# Patient Record
Sex: Male | Born: 1960 | Race: White | Hispanic: No | Marital: Married | Smoking: Never smoker
Health system: Southern US, Community
[De-identification: ages and names within clinical notes are randomized; demographics above are authoritative.]

## PROBLEM LIST (undated history)

## (undated) DIAGNOSIS — M1990 Unspecified osteoarthritis, unspecified site: Secondary | ICD-10-CM

## (undated) DIAGNOSIS — I10 Essential (primary) hypertension: Secondary | ICD-10-CM

## (undated) DIAGNOSIS — K219 Gastro-esophageal reflux disease without esophagitis: Secondary | ICD-10-CM

## (undated) HISTORY — PX: COLONOSCOPY: SHX5424

## (undated) HISTORY — DX: Gastro-esophageal reflux disease without esophagitis: K21.9

## (undated) HISTORY — PX: EXTRACORPOREAL SHOCK WAVE LITHOTRIPSY: SHX1557

## (undated) HISTORY — PX: JOINT REPLACEMENT: SHX530

---

## 2002-01-06 ENCOUNTER — Ambulatory Visit (HOSPITAL_COMMUNITY): Admission: RE | Admit: 2002-01-06 | Discharge: 2002-01-06 | Payer: Self-pay | Admitting: Family Medicine

## 2002-01-06 ENCOUNTER — Encounter: Payer: Self-pay | Admitting: Family Medicine

## 2003-09-01 ENCOUNTER — Ambulatory Visit (HOSPITAL_COMMUNITY): Admission: RE | Admit: 2003-09-01 | Discharge: 2003-09-01 | Payer: Self-pay | Admitting: Family Medicine

## 2003-09-05 ENCOUNTER — Ambulatory Visit (HOSPITAL_COMMUNITY): Admission: RE | Admit: 2003-09-05 | Discharge: 2003-09-05 | Payer: Self-pay | Admitting: Family Medicine

## 2004-01-25 ENCOUNTER — Inpatient Hospital Stay (HOSPITAL_COMMUNITY): Admission: RE | Admit: 2004-01-25 | Discharge: 2004-01-30 | Payer: Self-pay | Admitting: Orthopedic Surgery

## 2004-02-01 ENCOUNTER — Emergency Department (HOSPITAL_COMMUNITY): Admission: EM | Admit: 2004-02-01 | Discharge: 2004-02-01 | Payer: Self-pay | Admitting: Emergency Medicine

## 2004-03-06 ENCOUNTER — Encounter: Admission: RE | Admit: 2004-03-06 | Discharge: 2004-04-10 | Payer: Self-pay | Admitting: Orthopedic Surgery

## 2004-08-28 ENCOUNTER — Ambulatory Visit: Payer: Self-pay | Admitting: Internal Medicine

## 2004-09-03 ENCOUNTER — Ambulatory Visit (HOSPITAL_COMMUNITY): Admission: RE | Admit: 2004-09-03 | Discharge: 2004-09-03 | Payer: Self-pay | Admitting: *Deleted

## 2005-05-13 HISTORY — PX: JOINT REPLACEMENT: SHX530

## 2005-07-28 ENCOUNTER — Emergency Department (HOSPITAL_COMMUNITY): Admission: EM | Admit: 2005-07-28 | Discharge: 2005-07-28 | Payer: Self-pay | Admitting: Emergency Medicine

## 2005-09-02 ENCOUNTER — Ambulatory Visit (HOSPITAL_COMMUNITY): Admission: RE | Admit: 2005-09-02 | Discharge: 2005-09-02 | Payer: Self-pay | Admitting: Urology

## 2009-01-31 ENCOUNTER — Ambulatory Visit (HOSPITAL_COMMUNITY): Admission: RE | Admit: 2009-01-31 | Discharge: 2009-01-31 | Payer: Self-pay | Admitting: General Surgery

## 2010-02-12 ENCOUNTER — Inpatient Hospital Stay (HOSPITAL_COMMUNITY): Admission: RE | Admit: 2010-02-12 | Discharge: 2010-02-14 | Payer: Self-pay | Admitting: Orthopedic Surgery

## 2010-02-15 ENCOUNTER — Emergency Department (HOSPITAL_COMMUNITY): Admission: EM | Admit: 2010-02-15 | Discharge: 2010-02-15 | Payer: Self-pay | Admitting: Emergency Medicine

## 2010-07-25 LAB — CBC
Hemoglobin: 13.2 g/dL (ref 13.0–17.0)
Hemoglobin: 13.9 g/dL (ref 13.0–17.0)
MCHC: 33.7 g/dL (ref 30.0–36.0)
MCHC: 34 g/dL (ref 30.0–36.0)
MCV: 84.2 fL (ref 78.0–100.0)
MCV: 85 fL (ref 78.0–100.0)
Platelets: 214 10*3/uL (ref 150–400)
RBC: 4.58 MIL/uL (ref 4.22–5.81)
RBC: 4.91 MIL/uL (ref 4.22–5.81)
WBC: 11.6 10*3/uL — ABNORMAL HIGH (ref 4.0–10.5)

## 2010-07-25 LAB — BASIC METABOLIC PANEL
BUN: 10 mg/dL (ref 6–23)
BUN: 11 mg/dL (ref 6–23)
CO2: 27 mEq/L (ref 19–32)
Chloride: 104 mEq/L (ref 96–112)
Creatinine, Ser: 1.01 mg/dL (ref 0.4–1.5)
GFR calc non Af Amer: >60 mL/min (ref >60)
GFR calc non Af Amer: >60 mL/min (ref >60)
Glucose, Bld: 115 mg/dL — ABNORMAL HIGH (ref 70–99)
Sodium: 137 mEq/L (ref 135–145)

## 2010-07-25 LAB — PROTIME-INR: Prothrombin Time: 18.7 seconds — ABNORMAL HIGH (ref 11.6–15.2)

## 2010-07-26 LAB — COMPREHENSIVE METABOLIC PANEL
ALT: 35 U/L (ref 0–53)
AST: 30 U/L (ref 0–37)
Albumin: 4.5 g/dL (ref 3.5–5.2)
Alkaline Phosphatase: 47 U/L (ref 39–117)
BUN: 22 mg/dL (ref 6–23)
CO2: 29 mEq/L (ref 19–32)
GFR calc Af Amer: >60 mL/min (ref >60)
Sodium: 140 mEq/L (ref 135–145)
Total Bilirubin: 0.3 mg/dL (ref 0.3–1.2)

## 2010-07-26 LAB — URINALYSIS, ROUTINE W REFLEX MICROSCOPIC
Hgb urine dipstick: NEGATIVE
Ketones, ur: NEGATIVE mg/dL
Specific Gravity, Urine: 1.01 (ref 1.005–1.030)
pH: 6.5 (ref 5.0–8.0)

## 2010-07-26 LAB — CBC
MCV: 84.4 fL (ref 78.0–100.0)
Platelets: 247 10*3/uL (ref 150–400)
RBC: 5.52 MIL/uL (ref 4.22–5.81)
RDW: 14.7 % (ref 11.5–15.5)
WBC: 5.6 10*3/uL (ref 4.0–10.5)

## 2010-07-26 LAB — SURGICAL PCR SCREEN
MRSA, PCR: NEGATIVE
Staphylococcus aureus: POSITIVE — AB

## 2010-07-26 LAB — APTT: aPTT: 34 seconds (ref 24–37)

## 2010-09-28 NOTE — Consult Note (Signed)
NAME:  Frank Blake, Frank Blake NO.:  [id number]   MEDICAL RECORD NO.:  [id number]                   PATIENT TYPE:  INP   LOCATION:  0467                                 FACILITY:  [facility]   PHYSICIAN:  Sigmund I. Patsi Sears, M.D.         DATE OF BIRTH:  04/27/1961   DATE OF CONSULTATION:  DATE OF DISCHARGE:  01/30/2004                                   CONSULTATION   CHIEF COMPLAINT:  Urinary retention.   HISTORY OF PRESENT ILLNESS:  A 49 year old white male who had a right total  hip replacement on January 25, 2004.  Patient was ready to be DC'd today,  and Foley catheter was removed at 0830 a.m.  Patient only voided 50 cc.  I&O  cath was performed, and patient had about 750 cc out.  Patient states that  he has never had this problem before.  He gets an urge to go to the bathroom  but does not void very much.   PAST MEDICAL HISTORY:  Hypertension.   MEDICATIONS:  1.  Bisoprolol/hydrochlorothiazide 1 q.d.  2.  Amitriptyline 50 mg p.o. q.h.s.  3.  Percocet for pain.   REVIEW OF SYSTEMS:  Significant only for urinary incontinence.   PHYSICAL EXAMINATION:  VITAL SIGNS:  Temperature 99.0, pulse 114,  respirations 20, blood pressure 119/54.  GENERAL:  A 50 year old well-developed and well-nourished white male in no  acute distress.  HEENT:  Normocephalic.  Wears glasses.  Throat clear.  NECK:  Supple.  No JVD.  LUNGS:  Clear to auscultation bilaterally.  HEART:  Regular rate and rhythm.  ABDOMEN:  Soft, nontender.  Positive bowel sounds.   ASSESSMENT:  Retention, postoperative right hip replacement.   PLAN:  Patient can go home with Foley present.  Would put Foley catheter in  today.  Continue Flomax for a few more days, then have a voiding trial in  the office with me on Thursday.      HB/MEDQ  D:  01/30/2004  T:  01/31/2004  Job:  [id number]

## 2010-09-28 NOTE — Discharge Summary (Signed)
NAME:  CANIO, WINOKUR NO.:  [id number]   MEDICAL RECORD NO.:  [id number]          PATIENT TYPE:  INP   LOCATION:  0467                         FACILITY:  [facility]   PHYSICIAN:  Marlowe Kays, M.D.  DATE OF BIRTH:  16-Feb-1961   DATE OF ADMISSION:  01/25/2004  DATE OF DISCHARGE:  01/30/2004                                 DISCHARGE SUMMARY   ADMISSION DIAGNOSES:  1.  Severe osteoarthritis of the right hip.  2.  Hypertension.   DISCHARGE DIAGNOSES:  1.  Postoperative anemia.  2.  Urinary retention.   OPERATION:  On January 25, 2004 the patient underwent Osteonics total hip  replacement and arthroplasty of the right hip by utilizing ceramic  components, Dr. Darrelyn Hillock assisted.   CONSULTATIONS:  Urology by Jethro Bolus, M.D. and Terri Piedra, nurse  practitioner.   BRIEF HISTORY:  This 50 year old white male was referred to [place] through the  courtesy of Dr. Jimmy Footman due to continuing pain in his right hip and groin.  MRI had documented a cystic degeneration of both hips but more so on the  right than the left.  This was also the more painful hip.  He does recall  some problems with his ___________ as far as his hips are concerned.   HOSPITAL COURSE:  The patient tolerated the surgical procedure quite well.  The patient developed a postoperative anemia with a drop in hemoglobin to  7.6. We transfused him with 2 units of blood, bringing his hemoglobin back  up to 9.5 which he tolerated well.  During the worst anemia he was woozy and  dizzy, really did not want to get up and move about.  After that he did participate a little more with therapy.  We tried to get  him off the Foley catheter postoperatively, however when this was done, he  would build up urine in the bladder and had to be re-catheterized for as  much as 700 cc at a time.  We asked for urology consult.  They felt that he  had been treated appropriately with Flomax but he needed more of that  medication.  They will see him back in the office for a voiding trial.   The patient had done well as far as his hip was concerned.  It was felt that  he could be discharged home to follow with Dr. Patsi Sears.  The wound was  dry. Abdomen was soft, bowel sounds were present.   LABORATORY DATA:  Preoperative CBC was completely within normal limits,  hemoglobin was 15.5, hematocrit was 45.9, final hemoglobin was 9.5,  hematocrit was 28.3.  Blood chemistries were normal.  Urinalysis was  negative for urinary tract infection x2.  Blood type: A positive.  Electrocardiogram:  Normal sinus rhythm.  Chest x-ray showed heart and  mediastinal contours within normal limits.  Lungs were clear and no  effusions. Visualized skeletal was unremarkable.   CONDITION ON DISCHARGE:  Improved, stable.   PLAN:  The patient  is discharged to his home. He is to continue with his  Foley catheter and see Dr. Patsi Sears Thursday after  discharge.  He is to  continue his home medications and diet.   He is given Percocet 5/25 #50 1-2 q.4-6h p.r.n. pain, Robaxin 500 mg #30  with 2 refills 1 q.6h p.r.n. muscle spasm, and Coumadin per pharmacy.  Dr.  Patsi Sears is continuing him with the Flomax at home.  He will continue on  Coumadin protocol with Home Health and [facility] for 4 weeks after date of  surgery.      DLU/MEDQ  D:  02/06/2004  T:  02/06/2004  Job:  [id number]   cc:   Lemmie Evens, M.D.  [address]  [place]  [place] [address]  Fax: [phone]   Sigmund I. Patsi Sears, M.D.  [address]  [place]  [place] [address]  Fax: [phone]

## 2010-09-28 NOTE — H&P (Signed)
NAME:  Frank Blake, Frank Blake NO.:  [id number]   MEDICAL RECORD NO.:  [id number]          PATIENT TYPE:  AMB   LOCATION:  DAY                           FACILITY:  [facility]   PHYSICIAN:  Dalia Heading, M.D.  DATE OF BIRTH:  [birth year]   DATE OF ADMISSION:  DATE OF DISCHARGE:  LH                              HISTORY & PHYSICAL   CHIEF COMPLAINT:  Family history of colon carcinoma.   HISTORY OF PRESENT ILLNESS:  The patient is a 50 year old, white male,  who is referred for endoscopic evaluation.  He needs colonoscopy due to  a family history of colon carcinoma in his sister.  No abdominal pain,  weight loss, nausea, vomiting, diarrhea, constipation, melena, or  hematochezia have been noted.  He has never had a colonoscopy.   PAST MEDICAL HISTORY:  Includes hypertension, depression.   PAST SURGICAL HISTORY:  Hip surgery.   CURRENT MEDICATIONS:  Amitriptyline, bisoprolol/hydrochlorothiazide,  tamsulosin, Tri-flex, fish oil.   ALLERGIES:  No known drug allergies.   REVIEW OF SYSTEMS:  Noncontributory.   PHYSICAL EXAMINATION:  GENERAL:  The patient is a well-developed, well-  nourished, white male, in no acute distress.  LUNGS:  Clear to auscultation with equal breath sounds bilaterally.  HEART:  Regular rate and rhythm without S3, S4, or murmurs.  ABDOMEN:  Soft, nontender, nondistended.  No hepatosplenomegaly or  masses noted.  RECTAL:  Deferred to the procedure.   IMPRESSION:  Family history of colon carcinoma.   PLAN:  The patient is scheduled for a colonoscopy on January 31, 2009.  The risks and benefits of the procedure including bleeding and  perforation were fully explained to the patient, gave informed consent.      Dalia Heading, M.D.  Electronically Signed     MAJ/MEDQ  D:  01/12/2009  T:  01/12/2009  Job:  [id number]   cc:   Corrie Mckusick, M.D.  Fax: [phone]

## 2010-09-28 NOTE — Op Note (Signed)
NAME:  Frank Blake, Frank Blake NO.:  [id number]   MEDICAL RECORD NO.:  [id number]                   PATIENT TYPE:  INP   LOCATION:  0001                                 FACILITY:  [facility]   PHYSICIAN:  Marlowe Kays, M.D.               DATE OF BIRTH:  [birth year]   DATE OF PROCEDURE:  01/25/2004  DATE OF DISCHARGE:                                 OPERATIVE REPORT   PREOPERATIVE DIAGNOSIS:  Osteoarthritis, right hip.   POSTOPERATIVE DIAGNOSIS:  Osteoarthritis, right hip.   OPERATION:  Osteonics total hip replacement, right.   SURGEON:  Marlowe Kays, M.D.   ASSISTANT:  Georges Lynch. Darrelyn Hillock, M.D.   ANESTHESIA:  General.   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  Because of his age of 36, I felt  the ceramic hip was the treatment of choice.   PROCEDURE:  Prophylactic antibiotics.  Satisfactory general anesthesia.  A  Foley catheter inserted.  Left lateral decubitus position using the Mark II  frame.  The right hip was prepped with Duraprep and draped in a sterile  field, Ioban employed.  Posterolateral incision down to the fascia lata.  Zickel's band was cut, external rotators were detached from the femur and  the hip capsule identified.  Gluteus medius was protected.  Subtotal  capsulectomy performed, hip dislocated and cut beneath the femoral head with  a power saw and then cleaned the piriformis fossa of soft tissue and placed  a guide pin down, overdrilled it with the step-cut drill, followed by the  canal finder.  I then began the reaming process along the way, used the  greater trochanteric reamer and also made my definitive cut of the femoral  neck a fingerbreadth above the lesser trochanter.  We worked up to a #9  vertical reamer and then used the rasps, and the #9 deep was the size on  rasping as well.  We then reamed for the tip for the femoral component up to  13.5 and  used the gold broach to finalize the rasping of the canal.  We then  went to the acetabulum and completed the capsulectomy and began.  The soft  tissue was cleared from the fossa ovalis and I then used the deepening  reamer first of all to level off the acetabulum and then expanding reamers  up to a 54 were seen to get a good peripheral fit.  A trial cup was placed  and fit nicely.  Went through a trial reduction and found the position of  the cup to be in good position, and consequently I used the size 54 Trident  acetabulum, which I inserted, and stabilized the two screws, which were  individually drilled, measured, and screwed.  I then used the 0 ceramic  insert and we went through another trial reduction, finding that a +0 was  the ideal neck length.  I then inserted the size 9 Secure Fit noncemented  127 degree femoral component and after once again doing a trial reduction  placed a 0 C-tapered head.  The hip was reduced and found to be nice and  stable.  The wound was then irrigated well with sterile saline and closure  performed.  I reattached the external rotators through drill holes using #1  Vicryl.  Zickel's band was reapproximated with the same.  The fascia lata  was reapproximated with #1 Vicryl, the subcutaneous tissue deep with #1 and  more  superficially with 0 and 2-0 Vicryl, staples on the skin.  He was gently  placed on his back in an abduction pillow and then moved to his bed.  He  tolerated the procedure well and was taken to the recovery room in  satisfactory condition with no known complications.  Estimated blood loss  was 900 mL, no blood replacement.  X-ray to follow.                                               Marlowe Kays, M.D.    JA/MEDQ  D:  01/25/2004  T:  01/25/2004  Job:  [id number]

## 2010-09-28 NOTE — H&P (Signed)
NAME:  Frank Blake, Frank Blake NO.:  [id number]   MEDICAL RECORD NO.:  [id number]                   PATIENT TYPE:  INP   LOCATION:  NA                                   FACILITY:  [facility]   PHYSICIAN:  Marlowe Kays, M.D.               DATE OF BIRTH:  05-15-1960   DATE OF ADMISSION:  01/25/2004  DATE OF DISCHARGE:                                HISTORY & PHYSICAL   CHIEF COMPLAINT:  Pain in my hips, more so on my right than left.   HISTORY OF PRESENT ILLNESS:  This 50 year old, white male seen by [place] through  the courtesy of Dr. Jimmy Footman due to pain into his right hip.  The patient  has had a full work-up by Dr. Jimmy Footman as well as by his family physician,  Dr. Assunta Found.  He has documented on MRI mild cystic degeneration of the  hips and structural deformities.  The patient is certainly beyond the scope  of conservative care at this time and after much discussion, it was decided  to go ahead with a total hip replacement arthroplasty to the right hip.   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  He sort of describes a position that might have been  a breech birth.  He denies any teenage problems other than the fact that he  always felt that he kind of walked funny.  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  It is quite  reassuring that the patient has done so well over the years with that  problem.  He currently now is employed at [employer] in [place].  He has been  cleared preoperatively by Dr. Assunta Found for surgery.   PAST MEDICAL HISTORY:  The patient has some mild hypertension.   CURRENT MEDICATIONS:  1.  Bisoprolol/HCTZ 1 daily.  2.  Amitriptyline HCl 25 mg 2 tabs q.h.s.  3.  Darvocet for discomfort.   PAST SURGICAL HISTORY:  He has never had any surgeries.   FAMILY HISTORY:  Positive for arthritis.   SOCIAL HISTORY:  The patient is  married.  He is a shift Production designer, theatre/television/film at [employer].  No intake of alcohol or tobacco products, and Javell Blackburn is his wife.   REVIEW OF SYSTEMS:  CNS:  No seizure, stroke, paralysis, numbness, double  vision.  RESPIRATORY:  No productive cough, no hemoptysis, no shortness of  breath.  CARDIOVASCULAR:  No chest pain, no angina, no orthopnea.  GASTROINTESTINAL:  No nausea, vomiting, melena, or bloody stool.  GENITOURINARY:  No discharge or hematuria.  MUSCULOSKELETAL:  Primarily in  present illness with his right hip.   PHYSICAL EXAMINATION:  GENERAL:  Alert, cooperative, friendly, somewhat  apprehensive, 50 year old, white male.  VITAL SIGNS:  Blood pressure 110/72, pulse 80, respirations 12.  HEENT:  Normocephalic.  PERRLA.  EOM intact.  Oropharynx clear.  CHEST:  Clear to auscultation.  No rhonchi, no rales.  HEART:  Regular rate and rhythm.  No murmurs are heard.  ABDOMEN:  Soft, nontender.  Liver or spleen not felt.  GENITALIA/RECTAL:  Not done.  Not pertinent for present illness.  EXTREMITIES:  The patient has reduced range of motion of the right hip,  limited primarily with flexion, internal and external rotation.   ADMISSION DIAGNOSES:  1.  Severe osteoarthritis of the right hip.  2.  Hypertension   PLAN:  The patient will undergo total hip replacement arthroplasty of the  right hip.  Plan to have home health with Genevieve Norlander after his regular  hospitalization.     Dooley L. Cherlynn June.                 Marlowe Kays, M.D.    DLU/MEDQ  D:  01/19/2004  T:  01/19/2004  Job:  [id number]   cc:   Lemmie Evens, M.D.  [address]  [place]  [place] [address]  Fax: [phone]   Corrie Mckusick, M.D.  [phone] Laurell Josephs. [place]  [place]  [place] [address]  Fax: [phone]

## 2014-08-17 NOTE — H&P (Signed)
  NTS SOAP Note  Vital Signs:  Vitals as of: 10/16/2945: Systolic 654: Diastolic 80: Heart Rate 650: Temp 98.54F: Height 65ft 11in: Weight 210Lbs 0 Ounces: BMI 29.29  BMI : 29.29 kg/m2  Subjective: This 54 year old male presents for of need for screening colonoscopy.  Has a step sister with colon cancer.  Last had a colonoscopy six years ago.  No gi complaints at this time.  Review of Symptoms:  Constitutional:unremarkable   Head:unremarkable Eyes:unremarkable   Nose/Mouth/Throat:unremarkable Cardiovascular:  unremarkable Respiratory:unremarkable Gastrointestinal:  unremarkable   Genitourinary:unremarkable   Musculoskeletal:unremarkable Skin:unremarkable Hematolgic/Lymphatic:unremarkable   Allergic/Immunologic:unremarkable   Past Medical History:  Reviewed  Past Medical History  Surgical History: right hip replacement Allergies: nkda Medications: amitriptylline   Social History:Reviewed  Social History  Preferred Language: English Race:  White Ethnicity: Not Hispanic / Latino Age: 54 year Marital Status:  M Alcohol: no   Smoking Status: Never smoker reviewed on 08/11/2014 Functional Status reviewed on 08/11/2014 ------------------------------------------------ Bathing: Normal Cooking: Normal Dressing: Normal Driving: Normal Eating: Normal Managing Meds: Normal Oral Care: Normal Shopping: Normal Toileting: Normal Transferring: Normal Walking: Normal Cognitive Status reviewed on 08/11/2014 ------------------------------------------------ Attention: Normal Decision Making: Normal Language: Normal Memory: Normal Motor: Normal Perception: Normal Problem Solving: Normal Visual and Spatial: Normal   Family History:Reviewed  Family Health History Mother  Father  Sister, Living; Colon cancer;     Objective Information: General:Well appearing, well nourished in no distress. Heart:RRR, no murmur Lungs:  CTA bilaterally,  no wheezes, rhonchi, rales.  Breathing unlabored. Abdomen:Soft, NT/ND, no HSM, no masses. deferred to procedure  Assessment:family h/o colon carcinoma,  need for screening TCS  Diagnoses: V16.0  Z80.0 Family history of malignant neoplasm of gastrointestinal tract (Family history of malignant neoplasm of digestive organs)  Procedures: [phone] - OFFICE OUTPATIENT NEW 20 MINUTES    Plan:  Schedule for TCS on 09/13/14.   Patient Education:Alternative treatments to surgery were discussed with patient (and family).  Risks and benefits  of procedure including bleeding and perforation were fully explained to the patient (and family) who gave informed consent. Patient/family questions were addressed.  Follow-up:Pending Surgery

## 2014-09-13 ENCOUNTER — Encounter (HOSPITAL_COMMUNITY)
Admission: RE | Disposition: A | Discharge status: Home / Self Care | Payer: Self-pay | Source: Ambulatory Visit | Attending: General Surgery

## 2014-09-13 ENCOUNTER — Ambulatory Visit (HOSPITAL_COMMUNITY)
Admission: RE | Admit: 2014-09-13 | Discharge: 2014-09-13 | Disposition: A | Discharge status: Home / Self Care | Payer: BLUE CROSS/BLUE SHIELD | Source: Ambulatory Visit | Attending: General Surgery | Admitting: General Surgery

## 2014-09-13 ENCOUNTER — Encounter (HOSPITAL_COMMUNITY): Payer: Self-pay | Admitting: *Deleted

## 2014-09-13 DIAGNOSIS — Z800 Family history of malignant neoplasm of digestive organs: Secondary | ICD-10-CM | POA: Diagnosis not present

## 2014-09-13 DIAGNOSIS — Z96641 Presence of right artificial hip joint: Secondary | ICD-10-CM | POA: Insufficient documentation

## 2014-09-13 DIAGNOSIS — Z1211 Encounter for screening for malignant neoplasm of colon: Secondary | ICD-10-CM | POA: Diagnosis not present

## 2014-09-13 HISTORY — PX: COLONOSCOPY: SHX5424

## 2014-09-13 HISTORY — DX: Essential (primary) hypertension: I10

## 2014-09-13 SURGERY — COLONOSCOPY
Anesthesia: Moderate Sedation

## 2014-09-13 MED ORDER — MEPERIDINE HCL 50 MG/ML IJ SOLN
INTRAMUSCULAR | Status: DC | PRN
  Administered 2014-09-13: 50 mg via INTRAVENOUS

## 2014-09-13 MED ORDER — SODIUM CHLORIDE 0.9 % IV SOLN
INTRAVENOUS | Status: DC
  Administered 2014-09-13: 08:00:00 via INTRAVENOUS

## 2014-09-13 MED ORDER — STERILE WATER FOR IRRIGATION IR SOLN
Status: DC | PRN
  Administered 2014-09-13: 08:00:00

## 2014-09-13 MED ORDER — MEPERIDINE HCL 50 MG/ML IJ SOLN
INTRAMUSCULAR | Status: AC
  Filled 2014-09-13: qty 1

## 2014-09-13 MED ORDER — MIDAZOLAM HCL 5 MG/5ML IJ SOLN
INTRAMUSCULAR | Status: DC | PRN
  Administered 2014-09-13: 3 mg via INTRAVENOUS

## 2014-09-13 MED ORDER — MIDAZOLAM HCL 5 MG/5ML IJ SOLN
INTRAMUSCULAR | Status: AC
  Filled 2014-09-13: qty 5

## 2014-09-13 NOTE — Discharge Instructions (Signed)

## 2014-09-13 NOTE — Interval H&P Note (Signed)
History and Physical Interval Note:  09/13/2014 8:18 AM  Frank Blake  has presented today for surgery, with the diagnosis of screening, family history  The various methods of treatment have been discussed with the patient and family. After consideration of risks, benefits and other options for treatment, the patient has consented to  Procedure(s): COLONOSCOPY (N/A) as a surgical intervention .  The patient's history has been reviewed, patient examined, no change in status, stable for surgery.  I have reviewed the patient's chart and labs.  Questions were answered to the patient's satisfaction.     Aviva Signs A

## 2014-09-13 NOTE — Op Note (Signed)
[place], [address]   COLONOSCOPY PROCEDURE REPORT     EXAM DATE: 09/13/2014  PATIENT NAME:      Frank Blake, Frank Blake           MR #:      [id number]  BIRTHDATE:       30-May-1960      VISIT #:     [phone]  ATTENDING:     Aviva Signs, MD     STATUS:     outpatient ASSISTANT:  INDICATIONS:  The patient is a 54 yr old male here for a colonoscopy due to patient's immediate family history of colon cancer. PROCEDURE PERFORMED:     Colonoscopy, screening MEDICATIONS:     Demerol 50 mg IV and Versed 3 mg IV ESTIMATED BLOOD LOSS:     None  CONSENT: The patient understands the risks and benefits of the procedure and understands that these risks include, but are not limited to: sedation, allergic reaction, infection, perforation and/or bleeding. Alternative means of evaluation and treatment include, among others: physical exam, x-rays, and/or surgical intervention. The patient elects to proceed with this endoscopic procedure.  DESCRIPTION OF PROCEDURE: During intra-op preparation period all mechanical & medical equipment was checked for proper function. Hand hygiene and appropriate measures for infection prevention was taken. After the risks, benefits and alternatives of the procedure were thoroughly explained, Informed consent was verified, confirmed and timeout was successfully executed by the treatment team. A digital exam revealed no abnormalities of the rectum. The EG-[id number] ([id number]) endoscope was introduced through the anus and advanced to the cecum, which was identified by both the appendix and ileocecal valve. adequate (Trilyte was used) The instrument was then slowly withdrawn as the colon was fully examined.Estimated blood loss is zero unless otherwise noted in this procedure report.   COLON FINDINGS: A normal appearing cecum, ileocecal valve, and appendiceal orifice were identified.  The ascending, transverse, descending,  sigmoid colon, and rectum appeared unremarkable. Retroflexed views revealed no abnormalities. The scope was then completely withdrawn from the patient and the procedure terminated.  SCOPE WITHDRAWAL TIME: 6    ADVERSE EVENTS:      There were no immediate complications.  IMPRESSIONS:     Normal colonoscopy  RECOMMENDATIONS:     Repeat Colonoscopy in 5 years. RECALL:  _____________________________ Aviva Signs, MD eSigned:  Aviva Signs, MD 09/13/2014 8:38 AM   cc:  Sharilyn Sites, M.D.   CPT CODES:     [phone] Colonoscopy, flexible, proximal to splenic flexure; diagnostic, with or without collection of specimen(s) by brushing or washing, with or without colon decompression (separate procedure) ICD CODES:  The ICD and CPT codes recommended by this software are interpretations from the data that the clinical staff has captured with the software.  The verification of the translation of this report to the ICD and CPT codes and modifiers is the sole responsibility of the health care institution and practicing physician where this report was generated.  [facility]. will not be held responsible for the validity of the ICD and CPT codes included on this report.  AMA assumes no liability for data contained or not contained herein. CPT is a Designer, television/film set of the [employer].

## 2014-09-14 ENCOUNTER — Encounter (HOSPITAL_COMMUNITY): Payer: Self-pay | Admitting: General Surgery

## 2015-08-16 DIAGNOSIS — M7072 Other bursitis of hip, left hip: Secondary | ICD-10-CM | POA: Diagnosis not present

## 2015-08-24 DIAGNOSIS — Z008 Encounter for other general examination: Secondary | ICD-10-CM | POA: Diagnosis not present

## 2015-08-24 DIAGNOSIS — I10 Essential (primary) hypertension: Secondary | ICD-10-CM | POA: Diagnosis not present

## 2015-08-24 DIAGNOSIS — N400 Enlarged prostate without lower urinary tract symptoms: Secondary | ICD-10-CM | POA: Diagnosis not present

## 2015-08-24 DIAGNOSIS — E782 Mixed hyperlipidemia: Secondary | ICD-10-CM | POA: Diagnosis not present

## 2015-10-04 DIAGNOSIS — M546 Pain in thoracic spine: Secondary | ICD-10-CM | POA: Diagnosis not present

## 2015-10-04 DIAGNOSIS — M4004 Postural kyphosis, thoracic region: Secondary | ICD-10-CM | POA: Diagnosis not present

## 2015-10-04 DIAGNOSIS — M47812 Spondylosis without myelopathy or radiculopathy, cervical region: Secondary | ICD-10-CM | POA: Diagnosis not present

## 2016-03-20 DIAGNOSIS — M4004 Postural kyphosis, thoracic region: Secondary | ICD-10-CM | POA: Diagnosis not present

## 2016-03-20 DIAGNOSIS — M47812 Spondylosis without myelopathy or radiculopathy, cervical region: Secondary | ICD-10-CM | POA: Diagnosis not present

## 2016-03-20 DIAGNOSIS — M546 Pain in thoracic spine: Secondary | ICD-10-CM | POA: Diagnosis not present

## 2016-04-10 DIAGNOSIS — R972 Elevated prostate specific antigen [PSA]: Secondary | ICD-10-CM | POA: Diagnosis not present

## 2016-04-11 DIAGNOSIS — H60502 Unspecified acute noninfective otitis externa, left ear: Secondary | ICD-10-CM | POA: Diagnosis not present

## 2016-04-17 DIAGNOSIS — N401 Enlarged prostate with lower urinary tract symptoms: Secondary | ICD-10-CM | POA: Diagnosis not present

## 2016-04-17 DIAGNOSIS — N200 Calculus of kidney: Secondary | ICD-10-CM | POA: Diagnosis not present

## 2016-04-17 DIAGNOSIS — R350 Frequency of micturition: Secondary | ICD-10-CM | POA: Diagnosis not present

## 2016-04-17 DIAGNOSIS — R972 Elevated prostate specific antigen [PSA]: Secondary | ICD-10-CM | POA: Diagnosis not present

## 2016-04-18 DIAGNOSIS — H60502 Unspecified acute noninfective otitis externa, left ear: Secondary | ICD-10-CM | POA: Diagnosis not present

## 2016-05-16 DIAGNOSIS — Z6831 Body mass index (BMI) 31.0-31.9, adult: Secondary | ICD-10-CM | POA: Diagnosis not present

## 2016-05-16 DIAGNOSIS — I10 Essential (primary) hypertension: Secondary | ICD-10-CM | POA: Diagnosis not present

## 2016-05-16 DIAGNOSIS — Z1389 Encounter for screening for other disorder: Secondary | ICD-10-CM | POA: Diagnosis not present

## 2016-05-16 DIAGNOSIS — M1991 Primary osteoarthritis, unspecified site: Secondary | ICD-10-CM | POA: Diagnosis not present

## 2016-05-16 DIAGNOSIS — Z0001 Encounter for general adult medical examination with abnormal findings: Secondary | ICD-10-CM | POA: Diagnosis not present

## 2016-05-16 DIAGNOSIS — J069 Acute upper respiratory infection, unspecified: Secondary | ICD-10-CM | POA: Diagnosis not present

## 2016-06-14 DIAGNOSIS — Z96643 Presence of artificial hip joint, bilateral: Secondary | ICD-10-CM | POA: Diagnosis not present

## 2016-06-14 DIAGNOSIS — M25552 Pain in left hip: Secondary | ICD-10-CM | POA: Diagnosis not present

## 2016-06-14 DIAGNOSIS — Z471 Aftercare following joint replacement surgery: Secondary | ICD-10-CM | POA: Diagnosis not present

## 2016-06-14 DIAGNOSIS — M7072 Other bursitis of hip, left hip: Secondary | ICD-10-CM | POA: Diagnosis not present

## 2016-06-19 DIAGNOSIS — Z471 Aftercare following joint replacement surgery: Secondary | ICD-10-CM | POA: Diagnosis not present

## 2016-06-19 DIAGNOSIS — M25552 Pain in left hip: Secondary | ICD-10-CM | POA: Diagnosis not present

## 2016-06-20 DIAGNOSIS — Z471 Aftercare following joint replacement surgery: Secondary | ICD-10-CM | POA: Diagnosis not present

## 2016-06-20 DIAGNOSIS — M25552 Pain in left hip: Secondary | ICD-10-CM | POA: Diagnosis not present

## 2016-06-21 DIAGNOSIS — M25552 Pain in left hip: Secondary | ICD-10-CM | POA: Diagnosis not present

## 2016-06-21 DIAGNOSIS — Z471 Aftercare following joint replacement surgery: Secondary | ICD-10-CM | POA: Diagnosis not present

## 2016-06-27 DIAGNOSIS — Z471 Aftercare following joint replacement surgery: Secondary | ICD-10-CM | POA: Diagnosis not present

## 2016-06-27 DIAGNOSIS — M25552 Pain in left hip: Secondary | ICD-10-CM | POA: Diagnosis not present

## 2016-07-04 DIAGNOSIS — M25552 Pain in left hip: Secondary | ICD-10-CM | POA: Diagnosis not present

## 2016-07-04 DIAGNOSIS — Z471 Aftercare following joint replacement surgery: Secondary | ICD-10-CM | POA: Diagnosis not present

## 2016-07-08 DIAGNOSIS — Z471 Aftercare following joint replacement surgery: Secondary | ICD-10-CM | POA: Diagnosis not present

## 2016-07-08 DIAGNOSIS — M25552 Pain in left hip: Secondary | ICD-10-CM | POA: Diagnosis not present

## 2016-07-10 DIAGNOSIS — M4004 Postural kyphosis, thoracic region: Secondary | ICD-10-CM | POA: Diagnosis not present

## 2016-07-10 DIAGNOSIS — M546 Pain in thoracic spine: Secondary | ICD-10-CM | POA: Diagnosis not present

## 2016-07-10 DIAGNOSIS — M47812 Spondylosis without myelopathy or radiculopathy, cervical region: Secondary | ICD-10-CM | POA: Diagnosis not present

## 2016-07-11 DIAGNOSIS — M25552 Pain in left hip: Secondary | ICD-10-CM | POA: Diagnosis not present

## 2016-07-11 DIAGNOSIS — Z471 Aftercare following joint replacement surgery: Secondary | ICD-10-CM | POA: Diagnosis not present

## 2016-09-05 DIAGNOSIS — E669 Obesity, unspecified: Secondary | ICD-10-CM | POA: Diagnosis not present

## 2016-09-05 DIAGNOSIS — N400 Enlarged prostate without lower urinary tract symptoms: Secondary | ICD-10-CM | POA: Diagnosis not present

## 2016-09-05 DIAGNOSIS — I10 Essential (primary) hypertension: Secondary | ICD-10-CM | POA: Diagnosis not present

## 2016-09-05 DIAGNOSIS — Z008 Encounter for other general examination: Secondary | ICD-10-CM | POA: Diagnosis not present

## 2016-09-05 DIAGNOSIS — Z719 Counseling, unspecified: Secondary | ICD-10-CM | POA: Diagnosis not present

## 2016-09-05 DIAGNOSIS — E782 Mixed hyperlipidemia: Secondary | ICD-10-CM | POA: Diagnosis not present

## 2016-09-05 DIAGNOSIS — Z6830 Body mass index (BMI) 30.0-30.9, adult: Secondary | ICD-10-CM | POA: Diagnosis not present

## 2016-09-17 DIAGNOSIS — Z1389 Encounter for screening for other disorder: Secondary | ICD-10-CM | POA: Diagnosis not present

## 2016-09-17 DIAGNOSIS — B070 Plantar wart: Secondary | ICD-10-CM | POA: Diagnosis not present

## 2016-09-17 DIAGNOSIS — Z6830 Body mass index (BMI) 30.0-30.9, adult: Secondary | ICD-10-CM | POA: Diagnosis not present

## 2016-09-26 DIAGNOSIS — L110 Acquired keratosis follicularis: Secondary | ICD-10-CM | POA: Diagnosis not present

## 2016-09-26 DIAGNOSIS — M79671 Pain in right foot: Secondary | ICD-10-CM | POA: Diagnosis not present

## 2016-09-26 DIAGNOSIS — M25774 Osteophyte, right foot: Secondary | ICD-10-CM | POA: Diagnosis not present

## 2016-10-09 DIAGNOSIS — M47812 Spondylosis without myelopathy or radiculopathy, cervical region: Secondary | ICD-10-CM | POA: Diagnosis not present

## 2016-10-09 DIAGNOSIS — M546 Pain in thoracic spine: Secondary | ICD-10-CM | POA: Diagnosis not present

## 2016-10-09 DIAGNOSIS — M4004 Postural kyphosis, thoracic region: Secondary | ICD-10-CM | POA: Diagnosis not present

## 2016-10-14 DIAGNOSIS — L821 Other seborrheic keratosis: Secondary | ICD-10-CM | POA: Diagnosis not present

## 2016-10-14 DIAGNOSIS — L918 Other hypertrophic disorders of the skin: Secondary | ICD-10-CM | POA: Diagnosis not present

## 2016-10-24 DIAGNOSIS — M25774 Osteophyte, right foot: Secondary | ICD-10-CM | POA: Diagnosis not present

## 2016-10-24 DIAGNOSIS — M79671 Pain in right foot: Secondary | ICD-10-CM | POA: Diagnosis not present

## 2016-10-24 DIAGNOSIS — L110 Acquired keratosis follicularis: Secondary | ICD-10-CM | POA: Diagnosis not present

## 2017-01-01 DIAGNOSIS — M47812 Spondylosis without myelopathy or radiculopathy, cervical region: Secondary | ICD-10-CM | POA: Diagnosis not present

## 2017-01-01 DIAGNOSIS — M546 Pain in thoracic spine: Secondary | ICD-10-CM | POA: Diagnosis not present

## 2017-01-01 DIAGNOSIS — M4004 Postural kyphosis, thoracic region: Secondary | ICD-10-CM | POA: Diagnosis not present

## 2017-02-27 DIAGNOSIS — E669 Obesity, unspecified: Secondary | ICD-10-CM | POA: Diagnosis not present

## 2017-02-27 DIAGNOSIS — Z6830 Body mass index (BMI) 30.0-30.9, adult: Secondary | ICD-10-CM | POA: Diagnosis not present

## 2017-02-27 DIAGNOSIS — I10 Essential (primary) hypertension: Secondary | ICD-10-CM | POA: Diagnosis not present

## 2017-02-27 DIAGNOSIS — Z008 Encounter for other general examination: Secondary | ICD-10-CM | POA: Diagnosis not present

## 2017-02-27 DIAGNOSIS — Z7189 Other specified counseling: Secondary | ICD-10-CM | POA: Diagnosis not present

## 2017-02-27 DIAGNOSIS — E782 Mixed hyperlipidemia: Secondary | ICD-10-CM | POA: Diagnosis not present

## 2017-02-27 DIAGNOSIS — Z719 Counseling, unspecified: Secondary | ICD-10-CM | POA: Diagnosis not present

## 2017-04-15 DIAGNOSIS — M546 Pain in thoracic spine: Secondary | ICD-10-CM | POA: Diagnosis not present

## 2017-04-15 DIAGNOSIS — M47812 Spondylosis without myelopathy or radiculopathy, cervical region: Secondary | ICD-10-CM | POA: Diagnosis not present

## 2017-04-15 DIAGNOSIS — M4004 Postural kyphosis, thoracic region: Secondary | ICD-10-CM | POA: Diagnosis not present

## 2017-04-30 ENCOUNTER — Other Ambulatory Visit (HOSPITAL_COMMUNITY)
Admission: RE | Admit: 2017-04-30 | Discharge: 2017-04-30 | Disposition: A | Discharge status: Home / Self Care | Payer: BLUE CROSS/BLUE SHIELD | Source: Ambulatory Visit | Attending: Family Medicine | Admitting: Family Medicine

## 2017-04-30 DIAGNOSIS — R972 Elevated prostate specific antigen [PSA]: Secondary | ICD-10-CM | POA: Diagnosis not present

## 2017-04-30 LAB — PSA: PROSTATIC SPECIFIC ANTIGEN: 1.26 ng/mL (ref 0.00–4.00)

## 2017-05-09 ENCOUNTER — Other Ambulatory Visit (HOSPITAL_COMMUNITY)
Admission: RE | Admit: 2017-05-09 | Discharge: 2017-05-09 | Disposition: A | Discharge status: Home / Self Care | Payer: BLUE CROSS/BLUE SHIELD | Source: Ambulatory Visit | Attending: Urology | Admitting: Urology

## 2017-05-09 ENCOUNTER — Ambulatory Visit (INDEPENDENT_AMBULATORY_CARE_PROVIDER_SITE_OTHER): Payer: BLUE CROSS/BLUE SHIELD | Admitting: Urology

## 2017-05-09 DIAGNOSIS — R972 Elevated prostate specific antigen [PSA]: Secondary | ICD-10-CM | POA: Diagnosis not present

## 2017-05-09 DIAGNOSIS — Z87442 Personal history of urinary calculi: Secondary | ICD-10-CM | POA: Diagnosis not present

## 2017-05-09 DIAGNOSIS — N390 Urinary tract infection, site not specified: Secondary | ICD-10-CM | POA: Diagnosis not present

## 2017-05-09 DIAGNOSIS — N400 Enlarged prostate without lower urinary tract symptoms: Secondary | ICD-10-CM

## 2017-05-09 LAB — URINALYSIS, COMPLETE (UACMP) WITH MICROSCOPIC
Bacteria, UA: NONE SEEN
Bilirubin Urine: NEGATIVE
GLUCOSE, UA: NEGATIVE mg/dL
HGB URINE DIPSTICK: NEGATIVE
Ketones, ur: NEGATIVE mg/dL
NITRITE: NEGATIVE
Protein, ur: NEGATIVE mg/dL
SPECIFIC GRAVITY, URINE: 1.005 (ref 1.005–1.030)
Squamous Epithelial / LPF: NONE SEEN
pH: 6 (ref 5.0–8.0)

## 2017-05-12 LAB — URINE CULTURE

## 2017-05-28 DIAGNOSIS — Z1389 Encounter for screening for other disorder: Secondary | ICD-10-CM | POA: Diagnosis not present

## 2017-05-28 DIAGNOSIS — K219 Gastro-esophageal reflux disease without esophagitis: Secondary | ICD-10-CM | POA: Diagnosis not present

## 2017-05-28 DIAGNOSIS — N390 Urinary tract infection, site not specified: Secondary | ICD-10-CM | POA: Diagnosis not present

## 2017-05-28 DIAGNOSIS — Z6832 Body mass index (BMI) 32.0-32.9, adult: Secondary | ICD-10-CM | POA: Diagnosis not present

## 2017-05-28 DIAGNOSIS — R0789 Other chest pain: Secondary | ICD-10-CM | POA: Diagnosis not present

## 2017-05-28 DIAGNOSIS — Z0001 Encounter for general adult medical examination with abnormal findings: Secondary | ICD-10-CM | POA: Diagnosis not present

## 2017-05-28 DIAGNOSIS — E6609 Other obesity due to excess calories: Secondary | ICD-10-CM | POA: Diagnosis not present

## 2017-07-09 DIAGNOSIS — M47812 Spondylosis without myelopathy or radiculopathy, cervical region: Secondary | ICD-10-CM | POA: Diagnosis not present

## 2017-07-09 DIAGNOSIS — M546 Pain in thoracic spine: Secondary | ICD-10-CM | POA: Diagnosis not present

## 2017-07-09 DIAGNOSIS — M4004 Postural kyphosis, thoracic region: Secondary | ICD-10-CM | POA: Diagnosis not present

## 2017-07-16 DIAGNOSIS — H6983 Other specified disorders of Eustachian tube, bilateral: Secondary | ICD-10-CM | POA: Diagnosis not present

## 2017-07-16 DIAGNOSIS — Z6831 Body mass index (BMI) 31.0-31.9, adult: Secondary | ICD-10-CM | POA: Diagnosis not present

## 2017-07-16 DIAGNOSIS — J019 Acute sinusitis, unspecified: Secondary | ICD-10-CM | POA: Diagnosis not present

## 2017-07-16 DIAGNOSIS — E6609 Other obesity due to excess calories: Secondary | ICD-10-CM | POA: Diagnosis not present

## 2017-07-16 DIAGNOSIS — Z1389 Encounter for screening for other disorder: Secondary | ICD-10-CM | POA: Diagnosis not present

## 2017-08-06 DIAGNOSIS — Z1389 Encounter for screening for other disorder: Secondary | ICD-10-CM | POA: Diagnosis not present

## 2017-08-06 DIAGNOSIS — Z6830 Body mass index (BMI) 30.0-30.9, adult: Secondary | ICD-10-CM | POA: Diagnosis not present

## 2017-08-06 DIAGNOSIS — E6609 Other obesity due to excess calories: Secondary | ICD-10-CM | POA: Diagnosis not present

## 2017-08-06 DIAGNOSIS — L309 Dermatitis, unspecified: Secondary | ICD-10-CM | POA: Diagnosis not present

## 2017-08-06 DIAGNOSIS — N342 Other urethritis: Secondary | ICD-10-CM | POA: Diagnosis not present

## 2017-08-26 DIAGNOSIS — N200 Calculus of kidney: Secondary | ICD-10-CM | POA: Diagnosis not present

## 2017-08-26 DIAGNOSIS — N400 Enlarged prostate without lower urinary tract symptoms: Secondary | ICD-10-CM | POA: Diagnosis not present

## 2017-08-26 DIAGNOSIS — Z87440 Personal history of urinary (tract) infections: Secondary | ICD-10-CM | POA: Diagnosis not present

## 2017-09-01 DIAGNOSIS — H5213 Myopia, bilateral: Secondary | ICD-10-CM | POA: Diagnosis not present

## 2017-10-02 DIAGNOSIS — I10 Essential (primary) hypertension: Secondary | ICD-10-CM | POA: Diagnosis not present

## 2017-10-02 DIAGNOSIS — Z008 Encounter for other general examination: Secondary | ICD-10-CM | POA: Diagnosis not present

## 2017-10-02 DIAGNOSIS — Z719 Counseling, unspecified: Secondary | ICD-10-CM | POA: Diagnosis not present

## 2017-10-02 DIAGNOSIS — E782 Mixed hyperlipidemia: Secondary | ICD-10-CM | POA: Diagnosis not present

## 2017-10-08 DIAGNOSIS — M546 Pain in thoracic spine: Secondary | ICD-10-CM | POA: Diagnosis not present

## 2017-10-08 DIAGNOSIS — M4004 Postural kyphosis, thoracic region: Secondary | ICD-10-CM | POA: Diagnosis not present

## 2017-10-08 DIAGNOSIS — M47812 Spondylosis without myelopathy or radiculopathy, cervical region: Secondary | ICD-10-CM | POA: Diagnosis not present

## 2017-11-11 DIAGNOSIS — Z6830 Body mass index (BMI) 30.0-30.9, adult: Secondary | ICD-10-CM | POA: Diagnosis not present

## 2017-11-11 DIAGNOSIS — Z87440 Personal history of urinary (tract) infections: Secondary | ICD-10-CM | POA: Diagnosis not present

## 2017-11-11 DIAGNOSIS — N200 Calculus of kidney: Secondary | ICD-10-CM | POA: Diagnosis not present

## 2017-11-11 DIAGNOSIS — N401 Enlarged prostate with lower urinary tract symptoms: Secondary | ICD-10-CM | POA: Diagnosis not present

## 2017-12-31 DIAGNOSIS — M546 Pain in thoracic spine: Secondary | ICD-10-CM | POA: Diagnosis not present

## 2017-12-31 DIAGNOSIS — M4004 Postural kyphosis, thoracic region: Secondary | ICD-10-CM | POA: Diagnosis not present

## 2017-12-31 DIAGNOSIS — M47812 Spondylosis without myelopathy or radiculopathy, cervical region: Secondary | ICD-10-CM | POA: Diagnosis not present

## 2018-03-09 DIAGNOSIS — M79602 Pain in left arm: Secondary | ICD-10-CM | POA: Diagnosis not present

## 2018-03-09 DIAGNOSIS — Z6830 Body mass index (BMI) 30.0-30.9, adult: Secondary | ICD-10-CM | POA: Diagnosis not present

## 2018-03-09 DIAGNOSIS — M7712 Lateral epicondylitis, left elbow: Secondary | ICD-10-CM | POA: Diagnosis not present

## 2018-03-09 DIAGNOSIS — Z1389 Encounter for screening for other disorder: Secondary | ICD-10-CM | POA: Diagnosis not present

## 2018-03-09 DIAGNOSIS — E6609 Other obesity due to excess calories: Secondary | ICD-10-CM | POA: Diagnosis not present

## 2018-03-24 DIAGNOSIS — Z008 Encounter for other general examination: Secondary | ICD-10-CM | POA: Diagnosis not present

## 2018-03-24 DIAGNOSIS — E782 Mixed hyperlipidemia: Secondary | ICD-10-CM | POA: Diagnosis not present

## 2018-03-24 DIAGNOSIS — I10 Essential (primary) hypertension: Secondary | ICD-10-CM | POA: Diagnosis not present

## 2018-03-24 DIAGNOSIS — Z719 Counseling, unspecified: Secondary | ICD-10-CM | POA: Diagnosis not present

## 2018-03-26 DIAGNOSIS — M25522 Pain in left elbow: Secondary | ICD-10-CM | POA: Diagnosis not present

## 2018-04-16 DIAGNOSIS — M47812 Spondylosis without myelopathy or radiculopathy, cervical region: Secondary | ICD-10-CM | POA: Diagnosis not present

## 2018-04-16 DIAGNOSIS — M4004 Postural kyphosis, thoracic region: Secondary | ICD-10-CM | POA: Diagnosis not present

## 2018-04-16 DIAGNOSIS — M546 Pain in thoracic spine: Secondary | ICD-10-CM | POA: Diagnosis not present

## 2018-04-20 DIAGNOSIS — M546 Pain in thoracic spine: Secondary | ICD-10-CM | POA: Diagnosis not present

## 2018-04-20 DIAGNOSIS — M4004 Postural kyphosis, thoracic region: Secondary | ICD-10-CM | POA: Diagnosis not present

## 2018-04-20 DIAGNOSIS — M47812 Spondylosis without myelopathy or radiculopathy, cervical region: Secondary | ICD-10-CM | POA: Diagnosis not present

## 2018-04-22 DIAGNOSIS — M4004 Postural kyphosis, thoracic region: Secondary | ICD-10-CM | POA: Diagnosis not present

## 2018-04-22 DIAGNOSIS — M47812 Spondylosis without myelopathy or radiculopathy, cervical region: Secondary | ICD-10-CM | POA: Diagnosis not present

## 2018-04-22 DIAGNOSIS — M546 Pain in thoracic spine: Secondary | ICD-10-CM | POA: Diagnosis not present

## 2018-04-27 DIAGNOSIS — M4004 Postural kyphosis, thoracic region: Secondary | ICD-10-CM | POA: Diagnosis not present

## 2018-04-27 DIAGNOSIS — M47812 Spondylosis without myelopathy or radiculopathy, cervical region: Secondary | ICD-10-CM | POA: Diagnosis not present

## 2018-04-27 DIAGNOSIS — M546 Pain in thoracic spine: Secondary | ICD-10-CM | POA: Diagnosis not present

## 2018-05-04 DIAGNOSIS — M47812 Spondylosis without myelopathy or radiculopathy, cervical region: Secondary | ICD-10-CM | POA: Diagnosis not present

## 2018-05-04 DIAGNOSIS — M546 Pain in thoracic spine: Secondary | ICD-10-CM | POA: Diagnosis not present

## 2018-05-04 DIAGNOSIS — M4004 Postural kyphosis, thoracic region: Secondary | ICD-10-CM | POA: Diagnosis not present

## 2018-05-15 DIAGNOSIS — M25522 Pain in left elbow: Secondary | ICD-10-CM | POA: Diagnosis not present

## 2018-06-04 DIAGNOSIS — Z1389 Encounter for screening for other disorder: Secondary | ICD-10-CM | POA: Diagnosis not present

## 2018-06-04 DIAGNOSIS — Z8371 Family history of colonic polyps: Secondary | ICD-10-CM | POA: Diagnosis not present

## 2018-06-04 DIAGNOSIS — Z6832 Body mass index (BMI) 32.0-32.9, adult: Secondary | ICD-10-CM | POA: Diagnosis not present

## 2018-06-04 DIAGNOSIS — N200 Calculus of kidney: Secondary | ICD-10-CM | POA: Diagnosis not present

## 2018-06-04 DIAGNOSIS — Z0001 Encounter for general adult medical examination with abnormal findings: Secondary | ICD-10-CM | POA: Diagnosis not present

## 2018-06-04 DIAGNOSIS — I10 Essential (primary) hypertension: Secondary | ICD-10-CM | POA: Diagnosis not present

## 2018-07-20 DIAGNOSIS — H40033 Anatomical narrow angle, bilateral: Secondary | ICD-10-CM | POA: Diagnosis not present

## 2018-07-20 DIAGNOSIS — H2513 Age-related nuclear cataract, bilateral: Secondary | ICD-10-CM | POA: Diagnosis not present

## 2018-10-13 DIAGNOSIS — Z79899 Other long term (current) drug therapy: Secondary | ICD-10-CM | POA: Diagnosis not present

## 2018-10-13 DIAGNOSIS — E782 Mixed hyperlipidemia: Secondary | ICD-10-CM | POA: Diagnosis not present

## 2018-10-13 DIAGNOSIS — N400 Enlarged prostate without lower urinary tract symptoms: Secondary | ICD-10-CM | POA: Diagnosis not present

## 2018-10-13 DIAGNOSIS — Z139 Encounter for screening, unspecified: Secondary | ICD-10-CM | POA: Diagnosis not present

## 2018-10-21 DIAGNOSIS — N400 Enlarged prostate without lower urinary tract symptoms: Secondary | ICD-10-CM | POA: Diagnosis not present

## 2018-10-21 DIAGNOSIS — I10 Essential (primary) hypertension: Secondary | ICD-10-CM | POA: Diagnosis not present

## 2018-10-21 DIAGNOSIS — E782 Mixed hyperlipidemia: Secondary | ICD-10-CM | POA: Diagnosis not present

## 2018-11-17 DIAGNOSIS — R82998 Other abnormal findings in urine: Secondary | ICD-10-CM | POA: Diagnosis not present

## 2018-11-17 DIAGNOSIS — N401 Enlarged prostate with lower urinary tract symptoms: Secondary | ICD-10-CM | POA: Diagnosis not present

## 2018-11-17 DIAGNOSIS — Z87440 Personal history of urinary (tract) infections: Secondary | ICD-10-CM | POA: Diagnosis not present

## 2018-11-17 DIAGNOSIS — R351 Nocturia: Secondary | ICD-10-CM | POA: Diagnosis not present

## 2018-11-17 DIAGNOSIS — N200 Calculus of kidney: Secondary | ICD-10-CM | POA: Diagnosis not present

## 2018-11-25 DIAGNOSIS — N200 Calculus of kidney: Secondary | ICD-10-CM | POA: Diagnosis not present

## 2018-11-25 DIAGNOSIS — N390 Urinary tract infection, site not specified: Secondary | ICD-10-CM | POA: Diagnosis not present

## 2018-11-25 DIAGNOSIS — B952 Enterococcus as the cause of diseases classified elsewhere: Secondary | ICD-10-CM | POA: Diagnosis not present

## 2018-11-25 DIAGNOSIS — R351 Nocturia: Secondary | ICD-10-CM | POA: Diagnosis not present

## 2018-11-25 DIAGNOSIS — N401 Enlarged prostate with lower urinary tract symptoms: Secondary | ICD-10-CM | POA: Diagnosis not present

## 2018-12-08 DIAGNOSIS — Z87440 Personal history of urinary (tract) infections: Secondary | ICD-10-CM | POA: Diagnosis not present

## 2018-12-08 DIAGNOSIS — N401 Enlarged prostate with lower urinary tract symptoms: Secondary | ICD-10-CM | POA: Diagnosis not present

## 2018-12-08 DIAGNOSIS — N200 Calculus of kidney: Secondary | ICD-10-CM | POA: Diagnosis not present

## 2018-12-08 DIAGNOSIS — R351 Nocturia: Secondary | ICD-10-CM | POA: Diagnosis not present

## 2018-12-18 DIAGNOSIS — I10 Essential (primary) hypertension: Secondary | ICD-10-CM | POA: Diagnosis not present

## 2018-12-18 DIAGNOSIS — N200 Calculus of kidney: Secondary | ICD-10-CM | POA: Diagnosis not present

## 2018-12-18 DIAGNOSIS — Z79899 Other long term (current) drug therapy: Secondary | ICD-10-CM | POA: Diagnosis not present

## 2018-12-18 DIAGNOSIS — Z7982 Long term (current) use of aspirin: Secondary | ICD-10-CM | POA: Diagnosis not present

## 2018-12-18 DIAGNOSIS — Z1159 Encounter for screening for other viral diseases: Secondary | ICD-10-CM | POA: Diagnosis not present

## 2018-12-22 DIAGNOSIS — M546 Pain in thoracic spine: Secondary | ICD-10-CM | POA: Diagnosis not present

## 2018-12-22 DIAGNOSIS — M47812 Spondylosis without myelopathy or radiculopathy, cervical region: Secondary | ICD-10-CM | POA: Diagnosis not present

## 2018-12-22 DIAGNOSIS — M4004 Postural kyphosis, thoracic region: Secondary | ICD-10-CM | POA: Diagnosis not present

## 2018-12-23 DIAGNOSIS — Z7982 Long term (current) use of aspirin: Secondary | ICD-10-CM | POA: Diagnosis not present

## 2018-12-23 DIAGNOSIS — Z79899 Other long term (current) drug therapy: Secondary | ICD-10-CM | POA: Diagnosis not present

## 2018-12-23 DIAGNOSIS — Z1159 Encounter for screening for other viral diseases: Secondary | ICD-10-CM | POA: Diagnosis not present

## 2018-12-23 DIAGNOSIS — N200 Calculus of kidney: Secondary | ICD-10-CM | POA: Diagnosis not present

## 2018-12-23 DIAGNOSIS — I10 Essential (primary) hypertension: Secondary | ICD-10-CM | POA: Diagnosis not present

## 2019-01-06 DIAGNOSIS — Z87440 Personal history of urinary (tract) infections: Secondary | ICD-10-CM | POA: Diagnosis not present

## 2019-01-06 DIAGNOSIS — N200 Calculus of kidney: Secondary | ICD-10-CM | POA: Diagnosis not present

## 2019-03-15 DIAGNOSIS — M4004 Postural kyphosis, thoracic region: Secondary | ICD-10-CM | POA: Diagnosis not present

## 2019-03-15 DIAGNOSIS — M47812 Spondylosis without myelopathy or radiculopathy, cervical region: Secondary | ICD-10-CM | POA: Diagnosis not present

## 2019-03-15 DIAGNOSIS — M546 Pain in thoracic spine: Secondary | ICD-10-CM | POA: Diagnosis not present

## 2019-03-30 DIAGNOSIS — R0683 Snoring: Secondary | ICD-10-CM | POA: Diagnosis not present

## 2019-03-30 DIAGNOSIS — G4733 Obstructive sleep apnea (adult) (pediatric): Secondary | ICD-10-CM | POA: Insufficient documentation

## 2019-03-30 DIAGNOSIS — J342 Deviated nasal septum: Secondary | ICD-10-CM | POA: Insufficient documentation

## 2019-03-30 DIAGNOSIS — J343 Hypertrophy of nasal turbinates: Secondary | ICD-10-CM | POA: Diagnosis not present

## 2019-03-30 DIAGNOSIS — K1379 Other lesions of oral mucosa: Secondary | ICD-10-CM | POA: Diagnosis not present

## 2019-04-15 ENCOUNTER — Other Ambulatory Visit: Payer: Self-pay

## 2019-04-15 ENCOUNTER — Ambulatory Visit: Payer: BC Managed Care – PPO | Admitting: Neurology

## 2019-04-15 ENCOUNTER — Encounter: Payer: Self-pay | Admitting: Neurology

## 2019-04-15 VITALS — BP 118/61 | HR 76 | Temp 97.4°F | Ht 70.0 in | Wt 220.0 lb

## 2019-04-15 DIAGNOSIS — G4709 Other insomnia: Secondary | ICD-10-CM | POA: Diagnosis not present

## 2019-04-15 DIAGNOSIS — G4719 Other hypersomnia: Secondary | ICD-10-CM

## 2019-04-15 DIAGNOSIS — R0683 Snoring: Secondary | ICD-10-CM

## 2019-04-15 DIAGNOSIS — G4726 Circadian rhythm sleep disorder, shift work type: Secondary | ICD-10-CM

## 2019-04-15 DIAGNOSIS — G4710 Hypersomnia, unspecified: Secondary | ICD-10-CM | POA: Diagnosis not present

## 2019-04-15 DIAGNOSIS — M2619 Other specified anomalies of jaw-cranial base relationship: Secondary | ICD-10-CM | POA: Diagnosis not present

## 2019-04-15 DIAGNOSIS — G4730 Sleep apnea, unspecified: Secondary | ICD-10-CM

## 2019-04-15 NOTE — Progress Notes (Signed)
SLEEP MEDICINE CLINIC    Provider:  Larey Seat, MD  Primary Care Physician:  Sharilyn Sites, [place] [id number]     Referring Provider: Dr Wilburn Cornelia, ENT [phone]       Chief Complaint according to patient   Patient presents with:     New Patient (Initial Visit)      inspire interested patient, never had a sleep study, never tried CPAP but failed a dental device. snore that wakes me up; hard time falling asleep      HISTORY OF PRESENT ILLNESS:  Frank Blake is a 58 y.o. year old Caucasian male patient seen here as a referral on 04/15/2019 from Dr Wilburn Cornelia.  Chief concern according to patient :    inspire interested patient, never had a sleep study, never tried CPAP but failed a dental device. Snores loudly that it wakes him up; hard time falling asleep.  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 He wakes up between 2-3 AM every morning.      I have the pleasure of seeing Frank Blake today, a right-handed Caucasian male with a sleep disorder. He  has a past medical history of Hypertension.Marland Kitchen He also had 2 hip replacements and has kidney stones- hehe had advanced joint degeneration by 2007.    Sleep relevant medical history: Nocturia 2-3, difficulties to sleep through the night after first arousal. Often feels as if he choked.    Family medical /sleep history: no  other family member  with OSA,    Social history: Patient is working as now in day shift, he is a Secondary school teacher at [employer]  He lives in a household with his spouse.  The patient currently works first shift, but has a 15 year history of night shift work.  Pets are  present.Tobacco use- never .  ETOH use ; none , Caffeine intake in form of Coffee( o) Soda( sun drops caffeine) Tea (some) no energy drinks. Regular exercise in form of ; none    Hobbies: none.   Sleep habits are as follows: The patient's lunch  time is between 12.30  PM. Supper is 5.30.   The patient goes to bed at 9.30  PM and continues to sleep for 4 hours, wakes for 2 bathroom breaks, the first time at 2.30 AM.   The preferred sleep position is supine , with the support of one pillow. Dreams are reportedly rare. 5 AM is the usual rise time. The patient wakes up spontaneously.  He reports not feeling refreshed or restored in AM, with symptoms such as dry mouth , morning headaches  and residual fatigue.  Naps are taken on WE frequently, lasting from 60 to 120 minutes and are more refreshing than nocturnal sleep.    Review of Systems: Out of a complete 14 system review, the patient complains of only the following symptoms, and all other reviewed systems are negative.:  Fatigue, sleepiness , snoring, fragmented sleep, Insomnia Nocturia. Gasping, choking, coughing.    How likely are you to doze in the following situations: 0 = not likely, 1 = slight chance, 2 = moderate chance, 3 = high chance   Sitting and Reading? Watching Television? Sitting inactive in a public place (theater or meeting)? As a passenger in a car for an hour without a break? Lying down in the afternoon when circumstances permit? Sitting and talking to someone? Sitting quietly after lunch without alcohol? In a car, while stopped for  a few minutes in traffic?   Total =13/ 24 points   FSS endorsed at 14/ 63 points.   Social History   Socioeconomic History   Marital status: Married    Spouse name: Neoma Laming   Number of children: 0   Years of education: 12   Highest education level: Not on file  Occupational History   Not on file  Social Needs   Financial resource strain: Not on file   Food insecurity    Worry: Not on file    Inability: Not on file   Transportation needs    Medical: Not on file    Non-medical: Not on file  Tobacco Use   Smoking status: Never Smoker   Smokeless tobacco: Never Used  Substance and Sexual Activity   Alcohol use: No   Drug use: No   Sexual activity: Not on file  Lifestyle    Physical activity    Days per week: Not on file    Minutes per session: Not on file   Stress: Not on file  Relationships   Social connections    Talks on phone: Not on file    Gets together: Not on file    Attends religious service: Not on file    Active member of club or organization: Not on file    Attends meetings of clubs or organizations: Not on file    Relationship status: Not on file  Other Topics Concern   Not on file  Social History Narrative   Lives with wife   Caffeine- diet Sundrop, 3-4 20 oz cans    Family History  Problem Relation Age of Onset   Colon cancer Sister Passed at age 82   Other Father Passed at age 27       Hodgkins disease    Past Medical History:  Diagnosis Date   Hypertension     Past Surgical History:  Procedure Laterality Date   COLONOSCOPY     COLONOSCOPY N/A 09/13/2014   Procedure: COLONOSCOPY;  Surgeon: Aviva Signs Md, MD;  [facility];  Service: Gastroenterology;  Laterality: N/A;   EXTRACORPOREAL SHOCK WAVE LITHOTRIPSY Bilateral    JOINT REPLACEMENT Bilateral 2007   hip     Current Outpatient Medications on File Prior to Visit  Medication Sig Dispense Refill   amitriptyline (ELAVIL) 75 MG tablet amitriptyline 75 mg tablet  TAKE 1 TABLET BY MOUTH AT BEDTIME     aspirin 81 MG chewable tablet Chew 81 mg by mouth daily.     bisoprolol-hydrochlorothiazide (ZIAC) 5-6.25 MG per tablet Take 1 tablet by mouth daily.     Cholecalciferol (VITAMIN D-1000 MAX ST) 25 MCG (1000 UT) tablet Take by mouth.     Multiple Vitamin (MULTI-VITAMIN) tablet Take by mouth.     Omega-3 1000 MG CAPS Take by mouth.     tamsulosin (FLOMAX) 0.4 MG CAPS capsule Take 0.4 mg by mouth daily.     No current facility-administered medications on file prior to visit.     No Known Allergies  Physical exam:  Today's Vitals   04/15/19 1453  BP: 118/61  Pulse: 76  Temp: (!) 97.4 F (36.3 C)  Weight: 220 lb (99.8 kg)  Height: 5\' 10"   (1.778 m)   Body mass index is 31.57 kg/m.   Wt Readings from Last 3 Encounters:  04/15/19 220 lb (99.8 kg)  09/13/14 211 lb (95.7 kg)     Ht Readings from Last 3 Encounters:  04/15/19 5\' 10"  (1.778  m)  09/13/14 5\' 11"  (1.803 m)      General: The patient is awake, alert and appears not in acute distress. The patient is well groomed. Head: Normocephalic, atraumatic. Neck is supple. Mallampati:4 ,  neck circumference:18 inches. Tremolous tongue. Nasal airflow patent. Retrognathia is seen.  Dental status: intact. Cardiovascular:  Regular rate and cardiac rhythm by pulse,  without distended neck veins. Respiratory: Lungs are clear to auscultation.  Skin:  Without evidence of ankle edema, or rash. Trunk: The patient's posture is erect.   Neurologic exam : The patient is awake and alert, oriented to place and time.   Memory subjective described as intact.  Attention span & concentration ability appears normal.  Speech is fluent,  without  dysarthria, dysphonia or aphasia.  Mood and affect are appropriate.   Cranial nerves: no loss of smell or taste reported  Pupils are equal and briskly reactive to light. Funduscopic exam deferred. .  Extraocular movements in vertical and horizontal planes were intact and without nystagmus. No Diplopia. Visual fields by finger perimetry are intact. Hearing was intact to soft voice and finger rubbing. Facial sensation intact to fine touch. Facial motor strength is symmetric and tongue and uvula move midline.  Neck ROM : rotation, tilt and flexion extension were normal for age and shoulder shrug was symmetrical.    Motor exam: Symmetric bulk, tone and ROM.   Normal tone without cog wheeling, symmetric grip strength . Coordination: Rapid alternating movements in the fingers/hands were of normal speed.  The Finger-to-nose maneuver was intact without evidence of ataxia, dysmetria or tremor.   Gait and station: Patient could rise unassisted from a  seated position, walked without assistive device.  Stance is of normal width/ base and the patient turned with 3 steps.  Toe and heel walk were deferred.  Deep tendon reflexes: in the  upper and lower extremities are symmetric and intact.  Babinski response was deferred .       After spending a total time of  30 minutes face to face and additional time for physical and neurologic examination, review of laboratory studies,  personal review of imaging studies, reports and results of other testing and review of referral information / records as far as provided in visit, I have established the following assessments:  1) Mr Fuselier just celebrated his 9 birthday and has been snoring for a while, but when he changed to daytime work form night shift, his sleep was more fragmented , he continued to wake up early in AM and feels the rest  of the night is providedt less qualitative sleep.  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  2) amitriptyline helps a bit, 5 mg at night. Snoring remains. Choking remains.  Especially in supine sleep.  3) Retrognatia.     My Plan is to proceed with:  1) sleep test to verify if apnea is present and if this is  Apnea of a type that could benefit from an INSPIRE procedure.  2) the patient never tried CPAP.  3) shift work related insomnia    I would like to thank  for allowing me to meet with and to take care of this pleasant patient.   I plan to follow up either personally or through our NP within 2-3  month.   CC: I will share my notes with  Dr Jerrell Belfast.   Electronically signed by: Larey Seat, MD 04/15/2019 3:22 PM  [facility] Board certified by The American  Board of Sleep Medicine and Diplomate of the [place] of Sleep Medicine. Board certified In Neurology through the [place], Fellow of the [employer] of Neurology. Medical Director of [facility].

## 2019-04-15 NOTE — Patient Instructions (Signed)
Patient is interested in [place] Procedure.

## 2019-05-18 ENCOUNTER — Telehealth: Payer: Self-pay

## 2019-05-18 NOTE — Telephone Encounter (Signed)
We have attempted to call the patient two times to schedule sleep study.  Patient has been unavailable at the phone numbers we have on file and has not returned our calls. If patient calls back we will schedule them for their sleep study.  

## 2019-08-24 DIAGNOSIS — R001 Bradycardia, unspecified: Secondary | ICD-10-CM | POA: Diagnosis not present

## 2019-08-24 DIAGNOSIS — I10 Essential (primary) hypertension: Secondary | ICD-10-CM | POA: Diagnosis not present

## 2019-08-24 DIAGNOSIS — N200 Calculus of kidney: Secondary | ICD-10-CM | POA: Diagnosis not present

## 2019-08-24 DIAGNOSIS — Z6831 Body mass index (BMI) 31.0-31.9, adult: Secondary | ICD-10-CM | POA: Diagnosis not present

## 2019-08-24 DIAGNOSIS — Z1389 Encounter for screening for other disorder: Secondary | ICD-10-CM | POA: Diagnosis not present

## 2019-08-24 DIAGNOSIS — Z0001 Encounter for general adult medical examination with abnormal findings: Secondary | ICD-10-CM | POA: Diagnosis not present

## 2019-08-24 DIAGNOSIS — G4700 Insomnia, unspecified: Secondary | ICD-10-CM | POA: Diagnosis not present

## 2019-08-24 DIAGNOSIS — R972 Elevated prostate specific antigen [PSA]: Secondary | ICD-10-CM | POA: Diagnosis not present

## 2019-09-03 ENCOUNTER — Ambulatory Visit: Payer: Self-pay

## 2019-09-09 ENCOUNTER — Ambulatory Visit: Payer: BC Managed Care – PPO | Admitting: General Surgery

## 2019-09-09 ENCOUNTER — Other Ambulatory Visit: Payer: Self-pay

## 2019-09-09 ENCOUNTER — Encounter: Payer: Self-pay | Admitting: General Surgery

## 2019-09-09 VITALS — BP 129/74 | HR 69 | Temp 98.4°F | Resp 12 | Ht 71.0 in | Wt 221.0 lb

## 2019-09-09 DIAGNOSIS — Z1211 Encounter for screening for malignant neoplasm of colon: Secondary | ICD-10-CM

## 2019-09-09 MED ORDER — SUPREP BOWEL PREP KIT 17.5-3.13-1.6 GM/177ML PO SOLN: 1.0000 | Freq: Once | ORAL | 0 refills | Status: AC

## 2019-09-09 NOTE — Progress Notes (Signed)
Frank Blake; [id number]; 08-13-1960   HPI Patient is a 59 year old white male who was referred to my care by Dr. Hilma Favors for a screening colonoscopy.  His sister passed away with colon cancer and he is getting a screening colonoscopy every 5 years.  His last colonoscopy in 2016 was unremarkable.  No polyps were seen.  He denies any blood per rectum, abnormal diarrhea or constipation, abdominal pain, abnormal weight loss or gain.  He currently has 0 out of 10 abdominal pain. Past Medical History:  Diagnosis Date  . Hypertension     Past Surgical History:  Procedure Laterality Date  . COLONOSCOPY    . COLONOSCOPY N/A 09/13/2014   Procedure: COLONOSCOPY;  Surgeon: Aviva Signs Md, MD;  Location: [facility];  Service: Gastroenterology;  Laterality: N/A;  . EXTRACORPOREAL SHOCK WAVE LITHOTRIPSY Bilateral   . JOINT REPLACEMENT Bilateral 2007   hip    Family History  Problem Relation Age of Onset  . Colon cancer Sister   . Other Father        Hodgkins disease    Current Outpatient Medications on File Prior to Visit  Medication Sig Dispense Refill  . amitriptyline (ELAVIL) 75 MG tablet amitriptyline 75 mg tablet  TAKE 1 TABLET BY MOUTH AT BEDTIME    . aspirin 81 MG chewable tablet Chew 81 mg by mouth daily.    . bisoprolol-hydrochlorothiazide (ZIAC) 5-6.25 MG per tablet Take 1 tablet by mouth daily.    . Cholecalciferol (VITAMIN D-1000 MAX ST) 25 MCG (1000 UT) tablet Take by mouth.    . Multiple Vitamin (MULTI-VITAMIN) tablet Take by mouth.    . Omega-3 1000 MG CAPS Take by mouth.    . tamsulosin (FLOMAX) 0.4 MG CAPS capsule Take 0.4 mg by mouth daily.     No current facility-administered medications on file prior to visit.    No Known Allergies  Social History   Substance and Sexual Activity  Alcohol Use No    Social History   Tobacco Use  Smoking Status Never Smoker  Smokeless Tobacco Never Used    Review of Systems  Constitutional: Negative.   HENT: Negative.    Eyes: Negative.   Respiratory: Negative.   Cardiovascular: Negative.   Gastrointestinal: Negative.   Genitourinary: Negative.   Musculoskeletal: Positive for joint pain.  Skin: Negative.   Neurological: Negative.   Endo/Heme/Allergies: Negative.   Psychiatric/Behavioral: Negative.     Objective   Vitals:   09/09/19 0946  BP: 129/74  Pulse: 69  Resp: 12  Temp: 98.4 F (36.9 C)  SpO2: 95%    Physical Exam Vitals reviewed.  Constitutional:      Appearance: Normal appearance. He is normal weight. He is not ill-appearing.  HENT:     Head: Normocephalic and atraumatic.  Cardiovascular:     Rate and Rhythm: Normal rate and regular rhythm.     Heart sounds: Normal heart sounds. No murmur. No friction rub. No gallop.   Pulmonary:     Effort: Pulmonary effort is normal. No respiratory distress.     Breath sounds: Normal breath sounds. No stridor. No wheezing, rhonchi or rales.  Abdominal:     General: Bowel sounds are normal. There is no distension.     Palpations: Abdomen is soft. There is no mass.     Tenderness: There is no abdominal tenderness. There is no guarding or rebound.     Hernia: No hernia is present.  Skin:    General: Skin is warm and  dry.  Neurological:     Mental Status: He is alert and oriented to person, place, and time.    Previous colonoscopy report reviewed Assessment  Need for screening colonoscopy, immediate family history of colon cancer Plan   Patient is scheduled for a screening colonoscopy on 09/28/2019.  The risks and benefits of the procedure including bleeding and perforation were fully explained to the patient, who gave informed consent.  Patient is receiving his first dose of Covid vaccination tomorrow.  Suprep has been prescribed.

## 2019-09-09 NOTE — H&P (Signed)
Frank Blake; [id number]; [birth year]   HPI Patient is a 59 year old white male who was referred to my care by Dr. Hilma Favors for a screening colonoscopy.  His sister passed away with colon cancer and he is getting a screening colonoscopy every 5 years.  His last colonoscopy in 2016 was unremarkable.  No polyps were seen.  He denies any blood per rectum, abnormal diarrhea or constipation, abdominal pain, abnormal weight loss or gain.  He currently has 0 out of 10 abdominal pain. Past Medical History:  Diagnosis Date  . Hypertension     Past Surgical History:  Procedure Laterality Date  . COLONOSCOPY    . COLONOSCOPY N/A 09/13/2014   Procedure: COLONOSCOPY;  Surgeon: Aviva Signs Md, MD;  Location[facility];  Service: Gastroenterology;  Laterality: N/A;  . EXTRACORPOREAL SHOCK WAVE LITHOTRIPSY Bilateral   . JOINT REPLACEMENT Bilateral 2007   hip    Family History  Problem Relation Age of Onset  . Colon cancer Sister   . Other Father        Hodgkins disease    Current Outpatient Medications on File Prior to Visit  Medication Sig Dispense Refill  . amitriptyline (ELAVIL) 75 MG tablet amitriptyline 75 mg tablet  TAKE 1 TABLET BY MOUTH AT BEDTIME    . aspirin 81 MG chewable tablet Chew 81 mg by mouth daily.    . bisoprolol-hydrochlorothiazide (ZIAC) 5-6.25 MG per tablet Take 1 tablet by mouth daily.    . Cholecalciferol (VITAMIN D-1000 MAX ST) 25 MCG (1000 UT) tablet Take by mouth.    . Multiple Vitamin (MULTI-VITAMIN) tablet Take by mouth.    . Omega-3 1000 MG CAPS Take by mouth.    . tamsulosin (FLOMAX) 0.4 MG CAPS capsule Take 0.4 mg by mouth daily.     No current facility-administered medications on file prior to visit.    No Known Allergies  Social History   Substance and Sexual Activity  Alcohol Use No    Social History   Tobacco Use  Smoking Status Never Smoker  Smokeless Tobacco Never Used    Review of Systems  Constitutional: Negative.   HENT: Negative.    Eyes: Negative.   Respiratory: Negative.   Cardiovascular: Negative.   Gastrointestinal: Negative.   Genitourinary: Negative.   Musculoskeletal: Positive for joint pain.  Skin: Negative.   Neurological: Negative.   Endo/Heme/Allergies: Negative.   Psychiatric/Behavioral: Negative.     Objective   Vitals:   09/09/19 0946  BP: 129/74  Pulse: 69  Resp: 12  Temp: 98.4 F (36.9 C)  SpO2: 95%    Physical Exam Vitals reviewed.  Constitutional:      Appearance: Normal appearance. He is normal weight. He is not ill-appearing.  HENT:     Head: Normocephalic and atraumatic.  Cardiovascular:     Rate and Rhythm: Normal rate and regular rhythm.     Heart sounds: Normal heart sounds. No murmur. No friction rub. No gallop.   Pulmonary:     Effort: Pulmonary effort is normal. No respiratory distress.     Breath sounds: Normal breath sounds. No stridor. No wheezing, rhonchi or rales.  Abdominal:     General: Bowel sounds are normal. There is no distension.     Palpations: Abdomen is soft. There is no mass.     Tenderness: There is no abdominal tenderness. There is no guarding or rebound.     Hernia: No hernia is present.  Skin:    General: Skin is warm and  dry.  Neurological:     Mental Status: He is alert and oriented to person, place, and time.    Previous colonoscopy report reviewed Assessment  Need for screening colonoscopy, immediate family history of colon cancer Plan   Patient is scheduled for a screening colonoscopy on 09/28/2019.  The risks and benefits of the procedure including bleeding and perforation were fully explained to the patient, who gave informed consent.  Patient is receiving his first dose of Covid vaccination tomorrow.  Suprep has been prescribed.

## 2019-09-09 NOTE — Patient Instructions (Signed)
Colonoscopy, Adult A colonoscopy is a procedure to look at the entire large intestine. This procedure is done using a long, thin, flexible tube that has a camera on the end. You may have a colonoscopy:  As a part of normal colorectal screening.  If you have certain symptoms, such as: ? A low number of red blood cells in your blood (anemia). ? Diarrhea that does not go away. ? Pain in your abdomen. ? Blood in your stool. A colonoscopy can help screen for and diagnose medical problems, including:  Tumors.  Extra tissue that grows where mucus forms (polyps).  Inflammation.  Areas of bleeding. Tell your health care provider about:  Any allergies you have.  All medicines you are taking, including vitamins, herbs, eye drops, creams, and over-the-counter medicines.  Any problems you or family members have had with anesthetic medicines.  Any blood disorders you have.  Any surgeries you have had.  Any medical conditions you have.  Any problems you have had with having bowel movements.  Whether you are pregnant or may be pregnant. What are the risks? Generally, this is a safe procedure. However, problems may occur, including:  Bleeding.  Damage to your intestine.  Allergic reactions to medicines given during the procedure.  Infection. This is rare. What happens before the procedure? Bowel prep If you were prescribed a bowel prep to take by mouth (orally) to clean out your colon:  Take it as told by your health care provider. Starting the day before your procedure, you will need to drink a large amount of liquid medicine. The liquid will cause you to have many bowel movements of loose stool until your stool becomes almost clear or light green.  If your skin or the opening between the buttocks (anus) gets irritated from diarrhea, you may relieve the irritation using: ? Wipes with medicine in them, such as adult wet wipes with aloe and vitamin E. ? A product to soothe skin,  such as petroleum jelly.  If you vomit while drinking the bowel prep: ? Take a break for up to 60 minutes. ? Begin the bowel prep again. ? Call your health care provider if you keep vomiting or you cannot take the bowel prep without vomiting.  To clean out your colon, you may also be given: ? Laxative medicines. These help you have a bowel movement. ? Instructions for enema use. An enema is liquid medicine injected into your rectum. Medicines Ask your health care provider about:  Changing or stopping your regular medicines or supplements. This is especially important if you are taking iron supplements, diabetes medicines, or blood thinners.  Taking medicines such as aspirin and ibuprofen. These medicines can thin your blood. Do not take these medicines unless your health care provider tells you to take them.  Taking over-the-counter medicines, vitamins, herbs, and supplements. General instructions  Ask your health care provider what steps will be taken to help prevent infection. These may include washing skin with a germ-killing soap.  Plan to have someone take you home from the hospital or clinic. What happens during the procedure?   An IV will be inserted into one of your veins.  You may be given one or more of the following: ? A medicine to help you relax (sedative). ? A medicine to numb the area (local anesthetic). ? A medicine to make you fall asleep (general anesthetic). This is rarely needed.  You will lie on your side with your knees bent.  The tube will: ?  Have oil or gel put on it (be lubricated). ? Be inserted into your anus. ? Be gently eased through all parts of your large intestine.  Air will be sent into your colon to keep it open. This may cause some pressure or cramping.  Images will be taken with the camera and will appear on a screen.  A small tissue sample may be removed to be looked at under a microscope (biopsy). The tissue may be sent to a lab for  testing if any signs of problems are found.  If small polyps are found, they may be removed and checked for cancer cells.  When the procedure is finished, the tube will be removed. The procedure may vary among health care providers and hospitals. What happens after the procedure?  Your blood pressure, heart rate, breathing rate, and blood oxygen level will be monitored until you leave the hospital or clinic.  You may have a small amount of blood in your stool.  You may pass gas and have mild cramping or bloating in your abdomen. This is caused by the air that was used to open your colon during the exam.  Do not drive for 24 hours after the procedure.  It is up to you to get the results of your procedure. Ask your health care provider, or the department that is doing the procedure, when your results will be ready. Summary  A colonoscopy is a procedure to look at the entire large intestine.  Follow instructions from your health care provider about eating and drinking before the procedure.  If you were prescribed an oral bowel prep to clean out your colon, take it as told by your health care provider.  During the colonoscopy, a flexible tube with a camera on its end is inserted into the anus and then passed into the other parts of the large intestine. This information is not intended to replace advice given to you by your health care provider. Make sure you discuss any questions you have with your health care provider. Document Revised: 11/20/2018 Document Reviewed: 11/20/2018 Elsevier Patient Education  [place].

## 2019-09-10 ENCOUNTER — Ambulatory Visit: Payer: BC Managed Care – PPO | Attending: Internal Medicine

## 2019-09-10 DIAGNOSIS — Z23 Encounter for immunization: Secondary | ICD-10-CM

## 2019-09-10 NOTE — Progress Notes (Signed)
   Covid-19 Vaccination Clinic  Name:  Frank Blake    MRN: [id number] DOB: March 21, 1961  09/10/2019  Frank Blake was observed post Covid-19 immunization for 15 minutes without incident. He was provided with Vaccine Information Sheet and instruction to access the V-Safe system.   Frank Blake was instructed to call 911 with any severe reactions post vaccine: Marland Kitchen Difficulty breathing  . Swelling of face and throat  . A fast heartbeat  . A bad rash all over body  . Dizziness and weakness   Immunizations Administered    Name Date Dose VIS Date Route   Moderna COVID-19 Vaccine 09/10/2019 11:59 AM 0.5 mL 04/2019 Intramuscular   Manufacturer: Moderna   Lot: [id number]   [id number]

## 2019-09-24 ENCOUNTER — Other Ambulatory Visit (HOSPITAL_COMMUNITY)
Admission: RE | Admit: 2019-09-24 | Discharge: 2019-09-24 | Disposition: A | Discharge status: Home / Self Care | Payer: BC Managed Care – PPO | Source: Ambulatory Visit | Attending: General Surgery | Admitting: General Surgery

## 2019-09-24 ENCOUNTER — Other Ambulatory Visit: Payer: Self-pay

## 2019-09-24 DIAGNOSIS — Z01812 Encounter for preprocedural laboratory examination: Secondary | ICD-10-CM | POA: Insufficient documentation

## 2019-09-24 DIAGNOSIS — Z20822 Contact with and (suspected) exposure to covid-19: Secondary | ICD-10-CM | POA: Insufficient documentation

## 2019-09-25 LAB — SARS CORONAVIRUS 2 (TAT 6-24 HRS): SARS Coronavirus 2: NEGATIVE

## 2019-09-28 ENCOUNTER — Ambulatory Visit (HOSPITAL_COMMUNITY)
Admission: RE | Admit: 2019-09-28 | Discharge: 2019-09-28 | Disposition: A | Discharge status: Home / Self Care | Payer: BC Managed Care – PPO | Attending: General Surgery | Admitting: General Surgery

## 2019-09-28 ENCOUNTER — Other Ambulatory Visit: Payer: Self-pay

## 2019-09-28 ENCOUNTER — Encounter (HOSPITAL_COMMUNITY)
Admission: RE | Disposition: A | Discharge status: Home / Self Care | Payer: Self-pay | Source: Home / Self Care | Attending: General Surgery

## 2019-09-28 ENCOUNTER — Encounter (HOSPITAL_COMMUNITY): Payer: Self-pay | Admitting: General Surgery

## 2019-09-28 DIAGNOSIS — Z7982 Long term (current) use of aspirin: Secondary | ICD-10-CM | POA: Diagnosis not present

## 2019-09-28 DIAGNOSIS — I10 Essential (primary) hypertension: Secondary | ICD-10-CM | POA: Insufficient documentation

## 2019-09-28 DIAGNOSIS — Z79899 Other long term (current) drug therapy: Secondary | ICD-10-CM | POA: Diagnosis not present

## 2019-09-28 DIAGNOSIS — Z96643 Presence of artificial hip joint, bilateral: Secondary | ICD-10-CM | POA: Diagnosis not present

## 2019-09-28 DIAGNOSIS — Z800 Family history of malignant neoplasm of digestive organs: Secondary | ICD-10-CM

## 2019-09-28 DIAGNOSIS — Z1211 Encounter for screening for malignant neoplasm of colon: Secondary | ICD-10-CM | POA: Insufficient documentation

## 2019-09-28 HISTORY — PX: COLONOSCOPY: SHX5424

## 2019-09-28 SURGERY — COLONOSCOPY
Anesthesia: Moderate Sedation

## 2019-09-28 MED ORDER — STERILE WATER FOR IRRIGATION IR SOLN
Status: DC | PRN
  Administered 2019-09-28: 1.5 mL

## 2019-09-28 MED ORDER — MEPERIDINE HCL 50 MG/ML IJ SOLN
INTRAMUSCULAR | Status: AC
  Filled 2019-09-28: qty 1

## 2019-09-28 MED ORDER — MEPERIDINE HCL 50 MG/ML IJ SOLN
INTRAMUSCULAR | Status: DC | PRN
  Administered 2019-09-28: 50 mg via INTRAVENOUS

## 2019-09-28 MED ORDER — MIDAZOLAM HCL 5 MG/5ML IJ SOLN
INTRAMUSCULAR | Status: DC | PRN
  Administered 2019-09-28: 3 mg via INTRAVENOUS

## 2019-09-28 MED ORDER — SODIUM CHLORIDE 0.9 % IV SOLN: INTRAVENOUS | Status: DC

## 2019-09-28 MED ORDER — MIDAZOLAM HCL 5 MG/5ML IJ SOLN
INTRAMUSCULAR | Status: AC
  Filled 2019-09-28: qty 10

## 2019-09-28 NOTE — Op Note (Signed)
[facility] Patient Name: Frank Blake Procedure Date: 09/28/2019 7:09 AM MRN: [id number] Date of Birth: 1960-06-25 Attending MD: Aviva Signs , MD CSN: [id number] Age: 59 Admit Type: Outpatient Procedure:                Colonoscopy Indications:              Screening in patient at increased risk: Family                            history of 1st-degree relative with colorectal                            cancer Providers:                Aviva Signs, MD, Charlsie Quest. Theda Sers RN, RN, Aram Candela Referring MD:              Medicines:                Midazolam 3 mg IV, Meperidine 50 mg IV Complications:            No immediate complications. Estimated blood loss:                            None. Estimated Blood Loss:     Estimated blood loss: none. Procedure:                Pre-Anesthesia Assessment:                           - Prior to the procedure, a History and Physical                            was performed, and patient medications and                            allergies were reviewed. The patient is competent.                            The risks and benefits of the procedure and the                            sedation options and risks were discussed with the                            patient. All questions were answered and informed                            consent was obtained. Patient identification and                            proposed procedure were verified by the physician,                            the nurse  and the technician in the endoscopy                            suite. Mental Status Examination: alert and                            oriented. Airway Examination: normal oropharyngeal                            airway and neck mobility. Respiratory Examination:                            clear to auscultation. CV Examination: RRR, no                            murmurs, no S3 or S4. Prophylactic Antibiotics: The            patient does not require prophylactic antibiotics.                            Prior Anticoagulants: The patient has taken no                            previous anticoagulant or antiplatelet agents. ASA                            Grade Assessment: II - A patient with mild systemic                            disease. After reviewing the risks and benefits,                            the patient was deemed in satisfactory condition to                            undergo the procedure. The anesthesia plan was to                            use moderate sedation / analgesia (conscious                            sedation). Immediately prior to administration of                            medications, the patient was re-assessed for                            adequacy to receive sedatives. The heart rate,                            respiratory rate, oxygen saturations, blood                            pressure, adequacy of pulmonary ventilation, and  response to care were monitored throughout the                            procedure. The physical status of the patient was                            re-assessed after the procedure.                           After obtaining informed consent, the colonoscope                            was passed under direct vision. Throughout the                            procedure, the patient's blood pressure, pulse, and                            oxygen saturations were monitored continuously. The                            [id number] scope was introduced through                            the anus and advanced to the the cecum, identified                            by the appendiceal orifice, ileocecal valve and                            palpation. No anatomical landmarks were                            photographed. The entire colon was well visualized.                            The colonoscopy was performed without  difficulty.                            The patient tolerated the procedure well. The                            quality of the bowel preparation was adequate. The                            total duration of the procedure was 16 minutes.                            Scope withdrawal time was 10 minutes. Scope In: 7:23:48 AM Scope Out: 7:36:20 AM Scope Withdrawal Time: 0 hours 7 minutes 5 seconds  Total Procedure Duration: 0 hours 12 minutes 32 seconds  Findings:      The perianal and digital rectal examinations were normal.      The entire examined colon appeared normal on  direct and retroflexion       views. Impression:               - The entire examined colon is normal on direct and                            retroflexion views.                           - No specimens collected. Moderate Sedation:      Moderate (conscious) sedation was administered by the endoscopy nurse       and supervised by the endoscopist. The following parameters were       monitored: oxygen saturation, heart rate, blood pressure, and response       to care. Recommendation:           - Written discharge instructions were provided to                            the patient.                           - The signs and symptoms of potential delayed                            complications were discussed with the patient.                           - Patient has a contact number available for                            emergencies.                           - Return to normal activities tomorrow.                           - Resume previous diet.                           - Continue present medications.                           - Repeat colonoscopy in 5 years for screening                            purposes. Procedure Code(s):        --- Professional ---                           [phone], Colonoscopy, flexible; diagnostic, including                            collection of specimen(s) by brushing or washing,                             when performed (separate procedure) Diagnosis Code(s):        --- Professional ---  Z80.0, Family history of malignant neoplasm of                            digestive organs CPT copyright 2019 [employer]. All rights reserved. The codes documented in this report are preliminary and upon coder review may  be revised to meet current compliance requirements. Aviva Signs, MD Aviva Signs, MD 09/28/2019 7:42:31 AM This report has been signed electronically. Number of Addenda: 0

## 2019-09-28 NOTE — Discharge Instructions (Signed)
Colonoscopy, Adult, Care After This sheet gives you information about how to care for yourself after your procedure. Your health care provider may also give you more specific instructions. If you have problems or questions, contact your health care provider. What can I expect after the procedure? After the procedure, it is common to have:  A small amount of blood in your stool for 24 hours after the procedure.  Some gas.  Mild cramping or bloating of your abdomen. Follow these instructions at home: Eating and drinking   Drink enough fluid to keep your urine pale yellow.  Follow instructions from your health care provider about eating or drinking restrictions.  Resume your normal diet as instructed by your health care provider. Avoid heavy or fried foods that are hard to digest. Activity  Rest as told by your health care provider.  Avoid sitting for a long time without moving. Get up to take short walks every 1-2 hours. This is important to improve blood flow and breathing. Ask for help if you feel weak or unsteady.  Return to your normal activities as told by your health care provider. Ask your health care provider what activities are safe for you. Managing cramping and bloating   Try walking around when you have cramps or feel bloated.  Apply heat to your abdomen as told by your health care provider. Use the heat source that your health care provider recommends, such as a moist heat pack or a heating pad. ? Place a towel between your skin and the heat source. ? Leave the heat on for 20-30 minutes. ? Remove the heat if your skin turns bright red. This is especially important if you are unable to feel pain, heat, or cold. You may have a greater risk of getting burned. General instructions  For the first 24 hours after the procedure: ? Do not drive or use machinery. ? Do not sign important documents. ? Do not drink alcohol. ? Do your regular daily activities at a slower pace  than normal. ? Eat soft foods that are easy to digest.  Take over-the-counter and prescription medicines only as told by your health care provider.  Keep all follow-up visits as told by your health care provider. This is important. Contact a health care provider if:  You have blood in your stool 2-3 days after the procedure. Get help right away if you have:  More than a small spotting of blood in your stool.  Large blood clots in your stool.  Swelling of your abdomen.  Nausea or vomiting.  A fever.  Increasing pain in your abdomen that is not relieved with medicine. Summary  After the procedure, it is common to have a small amount of blood in your stool. You may also have mild cramping and bloating of your abdomen.  For the first 24 hours after the procedure, do not drive or use machinery, sign important documents, or drink alcohol.  Get help right away if you have a lot of blood in your stool, nausea or vomiting, a fever, or increased pain in your abdomen. This information is not intended to replace advice given to you by your health care provider. Make sure you discuss any questions you have with your health care provider. Document Revised: 11/23/2018 Document Reviewed: 11/23/2018 Elsevier Patient Education  2020 Elsevier Inc.  

## 2019-09-28 NOTE — Interval H&P Note (Signed)
History and Physical Interval Note:  09/28/2019 7:11 AM  Frank Blake  has presented today for surgery, with the diagnosis of Screening colonoscopy.  The various methods of treatment have been discussed with the patient and family. After consideration of risks, benefits and other options for treatment, the patient has consented to  Procedure(s): COLONOSCOPY (N/A) as a surgical intervention.  The patient's history has been reviewed, patient examined, no change in status, stable for surgery.  I have reviewed the patient's chart and labs.  Questions were answered to the patient's satisfaction.     Aviva Signs

## 2019-10-14 ENCOUNTER — Ambulatory Visit: Payer: BC Managed Care – PPO | Attending: Internal Medicine

## 2019-10-14 DIAGNOSIS — Z23 Encounter for immunization: Secondary | ICD-10-CM

## 2019-10-14 NOTE — Progress Notes (Signed)
   Covid-19 Vaccination Clinic  Name:  Frank Blake    MRN: [id number] DOB: Dec 21, 1960  10/14/2019  Mr. Thissell was observed post Covid-19 immunization for 15 minutes without incident. He was provided with Vaccine Information Sheet and instruction to access the V-Safe system.   Mr. Winkle was instructed to call 911 with any severe reactions post vaccine: Marland Kitchen Difficulty breathing  . Swelling of face and throat  . A fast heartbeat  . A bad rash all over body  . Dizziness and weakness   Immunizations Administered    Name Date Dose VIS Date Route   Moderna COVID-19 Vaccine 10/14/2019 12:06 PM 0.5 mL 04/2019 Intramuscular   Manufacturer: Moderna   Lot: [id number]   [id number]

## 2019-10-25 DIAGNOSIS — S134XXA Sprain of ligaments of cervical spine, initial encounter: Secondary | ICD-10-CM | POA: Diagnosis not present

## 2019-10-25 DIAGNOSIS — M4004 Postural kyphosis, thoracic region: Secondary | ICD-10-CM | POA: Diagnosis not present

## 2019-11-03 DIAGNOSIS — E6609 Other obesity due to excess calories: Secondary | ICD-10-CM | POA: Diagnosis not present

## 2019-11-03 DIAGNOSIS — B09 Unspecified viral infection characterized by skin and mucous membrane lesions: Secondary | ICD-10-CM | POA: Diagnosis not present

## 2019-11-03 DIAGNOSIS — Z6830 Body mass index (BMI) 30.0-30.9, adult: Secondary | ICD-10-CM | POA: Diagnosis not present

## 2020-01-24 DIAGNOSIS — M4004 Postural kyphosis, thoracic region: Secondary | ICD-10-CM | POA: Diagnosis not present

## 2020-01-24 DIAGNOSIS — S134XXA Sprain of ligaments of cervical spine, initial encounter: Secondary | ICD-10-CM | POA: Diagnosis not present

## 2020-03-28 ENCOUNTER — Encounter: Payer: Self-pay | Admitting: Urology

## 2020-03-28 ENCOUNTER — Other Ambulatory Visit: Payer: Self-pay

## 2020-03-28 ENCOUNTER — Ambulatory Visit (INDEPENDENT_AMBULATORY_CARE_PROVIDER_SITE_OTHER): Payer: BC Managed Care – PPO | Admitting: Urology

## 2020-03-28 ENCOUNTER — Ambulatory Visit (HOSPITAL_COMMUNITY)
Admission: RE | Admit: 2020-03-28 | Discharge: 2020-03-28 | Disposition: A | Discharge status: Home / Self Care | Payer: BC Managed Care – PPO | Source: Ambulatory Visit | Attending: Urology | Admitting: Urology

## 2020-03-28 VITALS — BP 130/75 | HR 71 | Temp 98.4°F | Ht 70.0 in | Wt 220.0 lb

## 2020-03-28 DIAGNOSIS — R350 Frequency of micturition: Secondary | ICD-10-CM | POA: Diagnosis not present

## 2020-03-28 DIAGNOSIS — N200 Calculus of kidney: Secondary | ICD-10-CM | POA: Diagnosis not present

## 2020-03-28 DIAGNOSIS — R972 Elevated prostate specific antigen [PSA]: Secondary | ICD-10-CM | POA: Diagnosis not present

## 2020-03-28 DIAGNOSIS — I878 Other specified disorders of veins: Secondary | ICD-10-CM | POA: Diagnosis not present

## 2020-03-28 LAB — URINALYSIS, ROUTINE W REFLEX MICROSCOPIC
Bilirubin, UA: NEGATIVE
Glucose, UA: NEGATIVE
Ketones, UA: NEGATIVE
Nitrite, UA: NEGATIVE
Protein,UA: NEGATIVE
RBC, UA: NEGATIVE
Specific Gravity, UA: 1.015 (ref 1.005–1.030)
Urobilinogen, Ur: 0.2 mg/dL (ref 0.2–1.0)
pH, UA: 6 (ref 5.0–7.5)

## 2020-03-28 LAB — MICROSCOPIC EXAMINATION
Bacteria, UA: NONE SEEN
RBC, Urine: NONE SEEN /hpf (ref 0–2)
Renal Epithel, UA: NONE SEEN /hpf

## 2020-03-28 LAB — BLADDER SCAN AMB NON-IMAGING: Scan Result: 270

## 2020-03-28 MED ORDER — TAMSULOSIN HCL 0.4 MG PO CAPS: 0.4000 mg | ORAL_CAPSULE | Freq: Every day | ORAL | 3 refills | Status: DC

## 2020-03-28 MED ORDER — TAMSULOSIN HCL 0.4 MG PO CAPS: 0.4000 mg | ORAL_CAPSULE | Freq: Every day | ORAL | 3 refills | Status: AC

## 2020-03-28 NOTE — Progress Notes (Signed)
03/28/2020 3:04 PM   Frank Blake Blake [birth year] [id number]  Referring provider: Sharilyn Sites, MD [address] [place],  [place] [address]  nephrolithiasis  HPI: Frank Blake Blake is a 59yo here for evaluation of nephrolithiasis, BPH, and elevated PSA. He was previously seen by Dr. Gaynelle Arabian, Dr. Jeffie Pollock and then Dr Lerry Liner. No stone events since last Aug 2020. No flank pain. He has a hx of BPH with nocturia and has bene on flomax for 10 years. He has nocturia 1x. Stream strong. No hesitancy. He is happy with his urination.  PSA 0.56 on 03/02/2020.  His records from [facility] are as follows: BPH  HPI: Frank Blake Blake is a 59 year-old male established patient who is here for follow up regarding further evaluation of BPH and lower urinary tract symptoms.  The patient states if he were to spend the rest of his life with his current urinary condition, he would be pleased.   He is doing well on no medication but had tried tamsulosin in the past and it caused hypotension.      CC: Elevated PSA-Established Patient  HPI: His last PSA was performed 04/30/2016. The last PSA value was 1.26. The patient states he does not take 5 alpha reductase inhibitor medication.   He has had a prostate biopsy done. Patient does not have a family history of prostate cancer.   He has had a history of an elevated PSA with a prior negative biopsy but his PSA has been back to normal for several years.      CC: I have kidney stones.  HPI: He is not currently having flank pain, back pain, groin pain, nausea, vomiting, fever or chills.   He has had eswl for treatment of his stones in the past.   He has had no symptoms of recurrence. He has large LE today but has no dysuria or malodorous urine. He has had prior UTI's but that has been remotely.      AUA Symptom Score: He never has the sensation of not emptying his bladder completely after finishing urinating. He never has to urinate again less that two hours after he  has finished urinating. He does not have to stop and start again several times when he urinates. He never finds it difficult to postpone urination. He never has a weak urinary stream. He never has to push or strain to begin urination. He never has to get up to urinate from the time he goes to bed until the time he gets up in the morning.   Calculated AUA Symptom Score: 0     PMH: Past Medical History:  Diagnosis Date  . GERD (gastroesophageal reflux disease)   . Hypertension     Surgical History: Past Surgical History:  Procedure Laterality Date  . COLONOSCOPY    . COLONOSCOPY N/A 09/13/2014   Procedure: COLONOSCOPY;  Surgeon: Aviva Signs Md, MD;  [facility];  Service: Gastroenterology;  Laterality: N/A;  . COLONOSCOPY N/A 09/28/2019   Procedure: COLONOSCOPY;  Surgeon: Aviva Signs, MD;  [facility];  Service: Gastroenterology;  Laterality: N/A;  . EXTRACORPOREAL SHOCK WAVE LITHOTRIPSY Bilateral   . JOINT REPLACEMENT Bilateral 2007   hip    Home Medications:  Allergies as of 03/28/2020   No Known Allergies     Medication List       Accurate as of March 28, 2020  3:04 PM. If you have any questions, ask your nurse or doctor.  amitriptyline 75 MG tablet Commonly known as: ELAVIL Take 75 mg by mouth at bedtime.   aspirin 81 MG chewable tablet Chew 81 mg by mouth daily.   Biotin 10000 MCG Tabs Take 10,000 mcg by mouth daily.   bisoprolol-hydrochlorothiazide 5-6.25 MG tablet Commonly known as: ZIAC Take 1 tablet by mouth daily.   Multi-Vitamin tablet Take 2 tablets by mouth daily.   Omega-3 1400 MG Caps Take 1,400 mg by mouth daily.   pantoprazole 40 MG tablet Commonly known as: PROTONIX Take 40 mg by mouth daily.   tamsulosin 0.4 MG Caps capsule Commonly known as: FLOMAX Take 0.4 mg by mouth daily.   Vitamin D-1000 Max St 25 MCG (1000 UT) tablet Generic drug: Cholecalciferol Take 1,000 Units by mouth daily.        Allergies: No Known Allergies  Family History: Family History  Problem Relation Age of Onset  . Colon cancer Sister   . Other Father        Hodgkins disease    Social History:  reports that he has never smoked. He has never used smokeless tobacco. He reports that he does not drink alcohol and does not use drugs.  ROS: All other review of systems were reviewed and are negative except what is noted above in HPI  Physical Exam: BP 130/75   Pulse 71   Temp 98.4 F (36.9 C)   Ht 5\' 10"  (1.778 m)   Wt 220 lb (99.8 kg)   BMI 31.57 kg/m   Constitutional:  Alert and oriented, No acute distress. HEENT: [place] AT, moist mucus membranes.  Trachea midline, no masses. Cardiovascular: No clubbing, cyanosis, or edema. Respiratory: Normal respiratory effort, no increased work of breathing. GI: Abdomen is soft, nontender, nondistended, no abdominal masses GU: No CVA tenderness. Circumcised phallus. No masses/lesions on penis, testis, scrotum. Prostate 40g smooth no nodules no induration.  Lymph: No cervical or inguinal lymphadenopathy. Skin: No rashes, bruises or suspicious lesions. Neurologic: Grossly intact, no focal deficits, moving all 4 extremities. Psychiatric: Normal mood and affect.  Laboratory Data: Lab Results  Component Value Date   WBC 11.6 (H) 02/14/2010   HGB 13.2 02/14/2010   HCT 38.9 (L) 02/14/2010   MCV 85.0 02/14/2010   PLT 181 02/14/2010    Lab Results  Component Value Date   CREATININE 1.01 02/14/2010    No results found for: PSA  No results found for: TESTOSTERONE  No results found for: HGBA1C  Urinalysis    Component Value Date/Time   COLORURINE STRAW (A) 05/09/2017 [place] 05/09/2017 1045   LABSPEC 1.005 05/09/2017 1045   PHURINE 6.0 05/09/2017 1045   GLUCOSEU NEGATIVE 05/09/2017 [place] 05/09/2017 [place] 05/09/2017 [place] 05/09/2017 1045   PROTEINUR NEGATIVE 05/09/2017  1045   UROBILINOGEN 0.2 02/02/2010 0924   NITRITE NEGATIVE 05/09/2017 1045   LEUKOCYTESUR LARGE (A) 05/09/2017 1045    Lab Results  Component Value Date   BACTERIA NONE SEEN 05/09/2017    Pertinent Imaging:  Results for orders placed during the hospital encounter of 09/02/05  DG Abd 1 View  Narrative Clinical Data:   Left renal calculus.  Pre-op for lithotripsy. ABDOMEN - 1 VIEW: Comparison:  CT on 07/28/05. Findings:  A radiopaque calculus is again seen in the lower pole of the left kidney measuring approximately 7 x 8 mm.  This is unchanged compared with the prior CT.  No other radiopaque urinary tract calculi are  seen.  The bowel gas pattern is normal.  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  Impression 8 mm left lower pole intrarenal calculus.  Provider: Higinio Plan, Windy Canny  No results found for this or any previous visit.  No results found for this or any previous visit.  No results found for this or any previous visit.  No results found for this or any previous visit.  No results found for this or any previous visit.  No results found for this or any previous visit.  No results found for this or any previous visit.   Assessment & Plan:    1. BPh with LUTS, nocturia, urianry frequency -continue flomax  - Urinalysis, Routine w reflex microscopic - BLADDER SCAN AMB NON-IMAGING  2. Elevated PSA -RTC 1 year with PSA  3. Nephrolithiasis -KUB today, if stable RTC 1 year with KUB   No follow-ups on file.  Nicolette Bang, MD  [facility] [place]

## 2020-03-28 NOTE — Progress Notes (Signed)
Bladder Scan Patient can void: 270 lb ml Performed By: Durenda Guthrie, LPN   Urological Symptom Review  Patient is experiencing the following symptoms: Frequent urination Get up at night to urinate Stream starts and stops   Review of Systems  Gastrointestinal (upper)  : Indigestion/heartburn  Gastrointestinal (lower) : Negative for lower GI symptoms  Constitutional : Negative for symptoms  Skin: Negative for skin symptoms  Eyes: Negative for eye symptoms  Ear/Nose/Throat : Negative for Ear/Nose/Throat symptoms  Hematologic/Lymphatic: Negative for Hematologic/Lymphatic symptoms  Cardiovascular : Negative for cardiovascular symptoms  Respiratory : Negative for respiratory symptoms  Endocrine: Negative for endocrine symptoms  Musculoskeletal: Joint pain  Neurological: Negative for neurological symptoms  Psychologic: Negative for psychiatric symptoms

## 2020-03-28 NOTE — Patient Instructions (Signed)

## 2020-04-05 NOTE — Progress Notes (Signed)
Results mailed 

## 2020-04-17 DIAGNOSIS — M4004 Postural kyphosis, thoracic region: Secondary | ICD-10-CM | POA: Diagnosis not present

## 2020-04-17 DIAGNOSIS — S134XXA Sprain of ligaments of cervical spine, initial encounter: Secondary | ICD-10-CM | POA: Diagnosis not present

## 2020-08-31 DIAGNOSIS — Z1331 Encounter for screening for depression: Secondary | ICD-10-CM | POA: Diagnosis not present

## 2020-08-31 DIAGNOSIS — R972 Elevated prostate specific antigen [PSA]: Secondary | ICD-10-CM | POA: Diagnosis not present

## 2020-08-31 DIAGNOSIS — Z1389 Encounter for screening for other disorder: Secondary | ICD-10-CM | POA: Diagnosis not present

## 2020-08-31 DIAGNOSIS — Z0001 Encounter for general adult medical examination with abnormal findings: Secondary | ICD-10-CM | POA: Diagnosis not present

## 2020-08-31 DIAGNOSIS — M1991 Primary osteoarthritis, unspecified site: Secondary | ICD-10-CM | POA: Diagnosis not present

## 2020-08-31 DIAGNOSIS — Z6832 Body mass index (BMI) 32.0-32.9, adult: Secondary | ICD-10-CM | POA: Diagnosis not present

## 2020-08-31 DIAGNOSIS — N200 Calculus of kidney: Secondary | ICD-10-CM | POA: Diagnosis not present

## 2020-08-31 DIAGNOSIS — Z8371 Family history of colonic polyps: Secondary | ICD-10-CM | POA: Diagnosis not present

## 2020-08-31 DIAGNOSIS — I10 Essential (primary) hypertension: Secondary | ICD-10-CM | POA: Diagnosis not present

## 2020-08-31 DIAGNOSIS — R7309 Other abnormal glucose: Secondary | ICD-10-CM | POA: Diagnosis not present

## 2020-09-26 DIAGNOSIS — R0683 Snoring: Secondary | ICD-10-CM | POA: Diagnosis not present

## 2020-09-26 DIAGNOSIS — J342 Deviated nasal septum: Secondary | ICD-10-CM | POA: Diagnosis not present

## 2020-09-26 DIAGNOSIS — J343 Hypertrophy of nasal turbinates: Secondary | ICD-10-CM | POA: Diagnosis not present

## 2020-09-26 DIAGNOSIS — K219 Gastro-esophageal reflux disease without esophagitis: Secondary | ICD-10-CM | POA: Diagnosis not present

## 2020-09-28 ENCOUNTER — Other Ambulatory Visit: Payer: Self-pay | Admitting: Otolaryngology

## 2020-10-19 ENCOUNTER — Other Ambulatory Visit: Payer: Self-pay

## 2020-10-19 ENCOUNTER — Encounter (HOSPITAL_BASED_OUTPATIENT_CLINIC_OR_DEPARTMENT_OTHER): Payer: Self-pay | Admitting: Otolaryngology

## 2020-10-23 ENCOUNTER — Encounter (HOSPITAL_BASED_OUTPATIENT_CLINIC_OR_DEPARTMENT_OTHER)
Admission: RE | Admit: 2020-10-23 | Discharge: 2020-10-23 | Disposition: A | Discharge status: Home / Self Care | Payer: BC Managed Care – PPO | Source: Ambulatory Visit | Attending: Otolaryngology | Admitting: Otolaryngology

## 2020-10-23 DIAGNOSIS — Z01818 Encounter for other preprocedural examination: Secondary | ICD-10-CM | POA: Diagnosis not present

## 2020-10-23 LAB — BASIC METABOLIC PANEL
Anion gap: 11 (ref 5–15)
BUN: 13 mg/dL (ref 6–20)
CO2: 25 mmol/L (ref 22–32)
Calcium: 9.9 mg/dL (ref 8.9–10.3)
Chloride: 102 mmol/L (ref 98–111)
Creatinine, Ser: 1.04 mg/dL (ref 0.61–1.24)
GFR, Estimated: >60 mL/min (ref >60)
Glucose, Bld: 94 mg/dL (ref 70–99)
Potassium: 4.6 mmol/L (ref 3.5–5.1)
Sodium: 138 mmol/L (ref 135–145)

## 2020-10-26 NOTE — Anesthesia Preprocedure Evaluation (Addendum)
Anesthesia Evaluation  Patient identified by MRN, date of birth, ID band Patient awake    Reviewed: Allergy & Precautions, H&P , NPO status , Patient's Chart, lab work & pertinent test results  Airway Mallampati: III  TM Distance: >3 FB Neck ROM: Full    Dental no notable dental hx. (+) Teeth Intact, Dental Advisory Given   Pulmonary neg pulmonary ROS, sleep apnea ,    Pulmonary exam normal breath sounds clear to auscultation       Cardiovascular Exercise Tolerance: Good hypertension, Pt. on medications Normal cardiovascular exam Rhythm:Regular Rate:Normal     Neuro/Psych negative neurological ROS  negative psych ROS   GI/Hepatic Neg liver ROS, GERD  Medicated,  Endo/Other  negative endocrine ROS  Renal/GU   negative genitourinary   Musculoskeletal  (+) Arthritis ,   Abdominal   Peds negative pediatric ROS (+)  Hematology negative hematology ROS (+)   Anesthesia Other Findings   Reproductive/Obstetrics negative OB ROS                           Anesthesia Physical Anesthesia Plan  ASA: 2  Anesthesia Plan: General   Post-op Pain Management:    Induction: Intravenous  PONV Risk Score and Plan: 2 and Ondansetron and Dexamethasone  Airway Management Planned: Oral ETT and LMA  Additional Equipment: None  Intra-op Plan:   Post-operative Plan: Extubation in OR  Informed Consent: I have reviewed the patients History and Physical, chart, labs and discussed the procedure including the risks, benefits and alternatives for the proposed anesthesia with the patient or authorized representative who has indicated his/her understanding and acceptance.       Plan Discussed with: CRNA and Anesthesiologist  Anesthesia Plan Comments: ( )        Anesthesia Quick Evaluation

## 2020-10-27 ENCOUNTER — Encounter (HOSPITAL_BASED_OUTPATIENT_CLINIC_OR_DEPARTMENT_OTHER): Payer: Self-pay | Admitting: Otolaryngology

## 2020-10-27 ENCOUNTER — Ambulatory Visit (HOSPITAL_BASED_OUTPATIENT_CLINIC_OR_DEPARTMENT_OTHER)
Admission: RE | Admit: 2020-10-27 | Discharge: 2020-10-27 | Disposition: A | Discharge status: Home / Self Care | Payer: BC Managed Care – PPO | Attending: Otolaryngology | Admitting: Otolaryngology

## 2020-10-27 ENCOUNTER — Ambulatory Visit (HOSPITAL_BASED_OUTPATIENT_CLINIC_OR_DEPARTMENT_OTHER): Payer: BC Managed Care – PPO | Admitting: Anesthesiology

## 2020-10-27 ENCOUNTER — Encounter (HOSPITAL_BASED_OUTPATIENT_CLINIC_OR_DEPARTMENT_OTHER)
Admission: RE | Disposition: A | Discharge status: Home / Self Care | Payer: Self-pay | Source: Home / Self Care | Attending: Otolaryngology

## 2020-10-27 ENCOUNTER — Other Ambulatory Visit: Payer: Self-pay

## 2020-10-27 DIAGNOSIS — J342 Deviated nasal septum: Secondary | ICD-10-CM | POA: Diagnosis not present

## 2020-10-27 DIAGNOSIS — I10 Essential (primary) hypertension: Secondary | ICD-10-CM | POA: Diagnosis not present

## 2020-10-27 DIAGNOSIS — Z7982 Long term (current) use of aspirin: Secondary | ICD-10-CM | POA: Insufficient documentation

## 2020-10-27 DIAGNOSIS — Z79899 Other long term (current) drug therapy: Secondary | ICD-10-CM | POA: Diagnosis not present

## 2020-10-27 DIAGNOSIS — G4733 Obstructive sleep apnea (adult) (pediatric): Secondary | ICD-10-CM | POA: Diagnosis not present

## 2020-10-27 DIAGNOSIS — J343 Hypertrophy of nasal turbinates: Secondary | ICD-10-CM | POA: Insufficient documentation

## 2020-10-27 HISTORY — PX: NASAL SEPTOPLASTY W/ TURBINOPLASTY: SHX2070

## 2020-10-27 HISTORY — DX: Unspecified osteoarthritis, unspecified site: M19.90

## 2020-10-27 SURGERY — SEPTOPLASTY, NOSE, WITH NASAL TURBINATE REDUCTION
Anesthesia: General | Site: Nose | Laterality: Bilateral

## 2020-10-27 MED ORDER — ACETAMINOPHEN 160 MG/5ML PO SOLN
325.0000 mg | ORAL | Status: DC | PRN
Start: 2020-10-27 — End: 2020-10-27

## 2020-10-27 MED ORDER — EPHEDRINE SULFATE 50 MG/ML IJ SOLN
INTRAMUSCULAR | Status: DC | PRN
  Administered 2020-10-27: 10 mg via INTRAVENOUS

## 2020-10-27 MED ORDER — FENTANYL CITRATE (PF) 100 MCG/2ML IJ SOLN
INTRAMUSCULAR | Status: DC | PRN
  Administered 2020-10-27: 100 ug via INTRAVENOUS

## 2020-10-27 MED ORDER — LACTATED RINGERS IV SOLN: INTRAVENOUS | Status: DC

## 2020-10-27 MED ORDER — BACITRACIN ZINC 500 UNIT/GM EX OINT
TOPICAL_OINTMENT | CUTANEOUS | Status: AC
  Filled 2020-10-27: qty 28.35

## 2020-10-27 MED ORDER — DEXAMETHASONE SODIUM PHOSPHATE 10 MG/ML IJ SOLN
INTRAMUSCULAR | Status: DC | PRN
  Administered 2020-10-27: 10 mg via INTRAVENOUS

## 2020-10-27 MED ORDER — ROCURONIUM BROMIDE 100 MG/10ML IV SOLN
INTRAVENOUS | Status: DC | PRN
  Administered 2020-10-27: 50 mg via INTRAVENOUS

## 2020-10-27 MED ORDER — MIDAZOLAM HCL 2 MG/2ML IJ SOLN
INTRAMUSCULAR | Status: AC
  Filled 2020-10-27: qty 2

## 2020-10-27 MED ORDER — OXYMETAZOLINE HCL 0.05 % NA SOLN
NASAL | Status: AC
  Filled 2020-10-27: qty 60

## 2020-10-27 MED ORDER — OXYCODONE HCL 5 MG PO TABS
5.0000 mg | ORAL_TABLET | Freq: Once | ORAL | Status: AC | PRN
  Administered 2020-10-27: 5 mg via ORAL

## 2020-10-27 MED ORDER — ONDANSETRON HCL 4 MG/2ML IJ SOLN
INTRAMUSCULAR | Status: AC
  Filled 2020-10-27: qty 2

## 2020-10-27 MED ORDER — MEPERIDINE HCL 25 MG/ML IJ SOLN: 6.2500 mg | INTRAMUSCULAR | Status: DC | PRN

## 2020-10-27 MED ORDER — ROCURONIUM BROMIDE 10 MG/ML (PF) SYRINGE
PREFILLED_SYRINGE | INTRAVENOUS | Status: AC
  Filled 2020-10-27: qty 10

## 2020-10-27 MED ORDER — ONDANSETRON HCL 4 MG/2ML IJ SOLN: 4.0000 mg | Freq: Once | INTRAMUSCULAR | Status: DC | PRN

## 2020-10-27 MED ORDER — EPHEDRINE 5 MG/ML INJ
INTRAVENOUS | Status: AC
  Filled 2020-10-27: qty 10

## 2020-10-27 MED ORDER — PROPOFOL 10 MG/ML IV BOLUS
INTRAVENOUS | Status: AC
  Filled 2020-10-27: qty 40

## 2020-10-27 MED ORDER — FENTANYL CITRATE (PF) 100 MCG/2ML IJ SOLN: 25.0000 ug | INTRAMUSCULAR | Status: DC | PRN

## 2020-10-27 MED ORDER — OXYMETAZOLINE HCL 0.05 % NA SOLN
NASAL | Status: DC | PRN
  Administered 2020-10-27: 1 via TOPICAL

## 2020-10-27 MED ORDER — LIDOCAINE-EPINEPHRINE 1 %-1:100000 IJ SOLN
INTRAMUSCULAR | Status: DC | PRN
  Administered 2020-10-27: 6 mL

## 2020-10-27 MED ORDER — LIDOCAINE HCL (PF) 2 % IJ SOLN
INTRAMUSCULAR | Status: AC
  Filled 2020-10-27: qty 5

## 2020-10-27 MED ORDER — MIDAZOLAM HCL 5 MG/5ML IJ SOLN
INTRAMUSCULAR | Status: DC | PRN
  Administered 2020-10-27: 2 mg via INTRAVENOUS

## 2020-10-27 MED ORDER — FENTANYL CITRATE (PF) 100 MCG/2ML IJ SOLN
INTRAMUSCULAR | Status: AC
  Filled 2020-10-27: qty 2

## 2020-10-27 MED ORDER — OXYCODONE HCL 5 MG/5ML PO SOLN: 5.0000 mg | Freq: Once | ORAL | Status: AC | PRN

## 2020-10-27 MED ORDER — SUGAMMADEX SODIUM 200 MG/2ML IV SOLN
INTRAVENOUS | Status: DC | PRN
  Administered 2020-10-27: 200 mg via INTRAVENOUS

## 2020-10-27 MED ORDER — MUPIROCIN 2 % EX OINT
TOPICAL_OINTMENT | CUTANEOUS | Status: DC | PRN
  Administered 2020-10-27: 1 via NASAL

## 2020-10-27 MED ORDER — LIDOCAINE 2% (20 MG/ML) 5 ML SYRINGE
INTRAMUSCULAR | Status: DC | PRN
  Administered 2020-10-27: 80 mg via INTRAVENOUS

## 2020-10-27 MED ORDER — CEFAZOLIN SODIUM-DEXTROSE 2-4 GM/100ML-% IV SOLN
2.0000 g | INTRAVENOUS | Status: AC
  Administered 2020-10-27: 2 g via INTRAVENOUS

## 2020-10-27 MED ORDER — ONDANSETRON HCL 4 MG/2ML IJ SOLN
INTRAMUSCULAR | Status: DC | PRN
  Administered 2020-10-27: 4 mg via INTRAVENOUS

## 2020-10-27 MED ORDER — OXYMETAZOLINE HCL 0.05 % NA SOLN
NASAL | Status: DC | PRN
  Administered 2020-10-27: 1 via NASAL

## 2020-10-27 MED ORDER — OXYMETAZOLINE HCL 0.05 % NA SOLN
NASAL | Status: AC
  Filled 2020-10-27: qty 30

## 2020-10-27 MED ORDER — DEXAMETHASONE SODIUM PHOSPHATE 10 MG/ML IJ SOLN
INTRAMUSCULAR | Status: AC
  Filled 2020-10-27: qty 1

## 2020-10-27 MED ORDER — ACETAMINOPHEN 325 MG PO TABS
325.0000 mg | ORAL_TABLET | ORAL | Status: DC | PRN
Start: 2020-10-27 — End: 2020-10-27

## 2020-10-27 MED ORDER — LIDOCAINE-EPINEPHRINE 1 %-1:100000 IJ SOLN
INTRAMUSCULAR | Status: AC
  Filled 2020-10-27: qty 1

## 2020-10-27 MED ORDER — CEFAZOLIN SODIUM-DEXTROSE 2-4 GM/100ML-% IV SOLN
INTRAVENOUS | Status: AC
  Filled 2020-10-27: qty 100

## 2020-10-27 MED ORDER — OXYCODONE HCL 5 MG PO TABS
ORAL_TABLET | ORAL | Status: AC
  Filled 2020-10-27: qty 1

## 2020-10-27 MED ORDER — MUPIROCIN 2 % EX OINT
TOPICAL_OINTMENT | CUTANEOUS | Status: AC
  Filled 2020-10-27: qty 22

## 2020-10-27 MED ORDER — CEPHALEXIN 500 MG PO CAPS: 500.0000 mg | ORAL_CAPSULE | Freq: Three times a day (TID) | ORAL | 0 refills | Status: AC

## 2020-10-27 MED ORDER — PROPOFOL 10 MG/ML IV BOLUS
INTRAVENOUS | Status: DC | PRN
  Administered 2020-10-27: 200 mg via INTRAVENOUS

## 2020-10-27 SURGICAL SUPPLY — 33 items
ATTRACTOMAT 16X20 MAGNETIC DRP (DRAPES) IMPLANT
BLADE SURG 15 STRL LF DISP TIS (BLADE) IMPLANT
BLADE SURG 15 STRL SS (BLADE)
CANISTER SUCT 1200ML W/VALVE (MISCELLANEOUS) ×2 IMPLANT
COAGULATOR SUCT 8FR VV (MISCELLANEOUS) IMPLANT
COVER WAND RF STERILE (DRAPES) IMPLANT
DECANTER SPIKE VIAL GLASS SM (MISCELLANEOUS) IMPLANT
DRSG NASOPORE 8CM (GAUZE/BANDAGES/DRESSINGS) IMPLANT
DRSG TELFA 3X8 NADH (GAUZE/BANDAGES/DRESSINGS) IMPLANT
ELECT REM PT RETURN 9FT ADLT (ELECTROSURGICAL) ×2
ELECTRODE REM PT RTRN 9FT ADLT (ELECTROSURGICAL) ×1 IMPLANT
GLOVE SURG ENC TEXT LTX SZ7 (GLOVE) ×4 IMPLANT
GLOVE SURG POLYISO LF SZ6.5 (GLOVE) ×2 IMPLANT
GLOVE SURG POLYISO LF SZ7 (GLOVE) ×2 IMPLANT
GLOVE SURG UNDER POLY LF SZ7 (GLOVE) ×4 IMPLANT
GOWN STRL REUS W/ TWL LRG LVL3 (GOWN DISPOSABLE) ×3 IMPLANT
GOWN STRL REUS W/TWL LRG LVL3 (GOWN DISPOSABLE) ×6
IV SET EXT 30 76VOL 4 MALE LL (IV SETS) ×2 IMPLANT
NEEDLE PRECISIONGLIDE 27X1.5 (NEEDLE) ×2 IMPLANT
NS IRRIG 1000ML POUR BTL (IV SOLUTION) ×2 IMPLANT
PACK BASIN DAY SURGERY FS (CUSTOM PROCEDURE TRAY) ×2 IMPLANT
PACK ENT DAY SURGERY (CUSTOM PROCEDURE TRAY) ×2 IMPLANT
SLEEVE SCD COMPRESS KNEE MED (STOCKING) ×2 IMPLANT
SPLINT NASAL AIRWAY SILICONE (MISCELLANEOUS) ×2 IMPLANT
SPONGE GAUZE 2X2 8PLY STRL LF (GAUZE/BANDAGES/DRESSINGS) ×2 IMPLANT
SPONGE NEURO XRAY DETECT 1X3 (DISPOSABLE) ×2 IMPLANT
SPONGE SURGIFOAM ABS GEL 12-7 (HEMOSTASIS) IMPLANT
SUT ETHILON 3 0 PS 1 (SUTURE) ×2 IMPLANT
SUT PLAIN 4 0 ~~LOC~~ 1 (SUTURE) ×2 IMPLANT
TOWEL GREEN STERILE FF (TOWEL DISPOSABLE) ×2 IMPLANT
TUBE SALEM SUMP 12R W/ARV (TUBING) IMPLANT
TUBE SALEM SUMP 16 FR W/ARV (TUBING) ×2 IMPLANT
YANKAUER SUCT BULB TIP NO VENT (SUCTIONS) ×2 IMPLANT

## 2020-10-27 NOTE — H&P (Signed)
Frank Blake is an 60 y.o. male.   Chief Complaint: Nasal Obstruction HPI: Hx of deviated septum  Past Medical History:  Diagnosis Date   Arthritis    GERD (gastroesophageal reflux disease)    Hypertension     Past Surgical History:  Procedure Laterality Date   COLONOSCOPY     COLONOSCOPY N/A 09/13/2014   Procedure: COLONOSCOPY;  Surgeon: Aviva Signs Md, MD;  [facility];  Service: Gastroenterology;  Laterality: N/A;   COLONOSCOPY N/A 09/28/2019   Procedure: COLONOSCOPY;  Surgeon: Aviva Signs, MD;  [facility];  Service: Gastroenterology;  Laterality: N/A;   EXTRACORPOREAL SHOCK WAVE LITHOTRIPSY Bilateral    JOINT REPLACEMENT Bilateral 2007   hip    Family History  Problem Relation Age of Onset   Colon cancer Sister    Other Father        Hodgkins disease   Social History:  reports that he has never smoked. He has never used smokeless tobacco. He reports that he does not drink alcohol and does not use drugs.  Allergies: No Known Allergies  Medications Prior to Admission  Medication Sig Dispense Refill   amitriptyline (ELAVIL) 75 MG tablet Take 75 mg by mouth at bedtime.      aspirin 81 MG chewable tablet Chew 81 mg by mouth daily.     Biotin 10000 MCG TABS Take 10,000 mcg by mouth daily.     bisoprolol-hydrochlorothiazide (ZIAC) 5-6.25 MG tablet Take 1 tablet by mouth daily.      Cholecalciferol (VITAMIN D-1000 MAX ST) 25 MCG (1000 UT) tablet Take 1,000 Units by mouth daily.      Multiple Vitamin (MULTI-VITAMIN) tablet Take 2 tablets by mouth daily.      Omega-3 1400 MG CAPS Take 1,400 mg by mouth daily.      pantoprazole (PROTONIX) 40 MG tablet Take 40 mg by mouth daily.     tamsulosin (FLOMAX) 0.4 MG CAPS capsule Take 1 capsule (0.4 mg total) by mouth daily after supper. 90 capsule 3    No results found for this or any previous visit (from the past 48 hour(s)). No results found.  Review of Systems  HENT:  Positive for congestion.    Respiratory: Negative.     Blood pressure (!) 121/48, pulse 70, temperature 98.2 F (36.8 C), temperature source Oral, resp. rate 18, height 5\' 10"  (1.778 m), weight 99.9 kg, SpO2 100 %. Physical Exam HENT:     Nose:     Comments: deviated septum Cardiovascular:     Rate and Rhythm: Normal rate.  Pulmonary:     Effort: Pulmonary effort is normal.  Musculoskeletal:     Cervical back: Normal range of motion.  Neurological:     Mental Status: He is alert.     Assessment/Plan Adm for OP Septoplasty and IT reduction  Jerrell Belfast, MD 10/27/2020, 7:24 AM

## 2020-10-27 NOTE — Transfer of Care (Signed)
Immediate Anesthesia Transfer of Care Note  Patient: Frank Blake  Procedure(s) Performed: NASAL SEPTOPLASTY WITH TURBINATE REDUCTION (Bilateral: Nose)  Patient Location: PACU  Anesthesia Type:General  Level of Consciousness: drowsy  Airway & Oxygen Therapy: Patient Spontanous Breathing and Patient connected to face mask oxygen  Post-op Assessment: Report given to RN and Post -op Vital signs reviewed and stable  Post vital signs: Reviewed and stable  Last Vitals:  Vitals Value Taken Time  BP 119/72 10/27/20 0827  Temp 36.7 C 10/27/20 0827  Pulse 72 10/27/20 0829  Resp 15 10/27/20 0829  SpO2 97 % 10/27/20 0829  Vitals shown include unvalidated device data.  Last Pain:  Vitals:   10/27/20 0635  TempSrc: Oral  PainSc: 0-No pain      Patients Stated Pain Goal: 6 (75/05/18 3358)  Complications: No notable events documented.

## 2020-10-27 NOTE — Anesthesia Postprocedure Evaluation (Signed)
Anesthesia Post Note  Patient: Frank Blake  Procedure(s) Performed: NASAL SEPTOPLASTY WITH TURBINATE REDUCTION (Bilateral: Nose)     Patient location during evaluation: PACU Anesthesia Type: General Level of consciousness: awake and alert Pain management: pain level controlled Vital Signs Assessment: post-procedure vital signs reviewed and stable Respiratory status: spontaneous breathing, nonlabored ventilation, respiratory function stable and patient connected to nasal cannula oxygen Cardiovascular status: blood pressure returned to baseline and stable Postop Assessment: no apparent nausea or vomiting Anesthetic complications: no   No notable events documented.  Last Vitals:  Vitals:   10/27/20 0845 10/27/20 0856  BP: 125/74 126/66  Pulse: 83 82  Resp: 17 18  Temp:  36.9 C  SpO2: 94% 93%    Last Pain:  Vitals:   10/27/20 0858  TempSrc:   PainSc: 4                  Breslin Hemann

## 2020-10-27 NOTE — Op Note (Signed)
Operative Note: SEPTOPLASTY AND INFERIOR TURBINATE REDUCTION  Patient: [name] record number: [id number]  Date:10/27/2020  Pre-operative Indications: 1. Deviated nasal septum with nasal airway obstruction     2.  Bilateral inferior turbinate hypertrophy  Postoperative Indications: Same  Surgical Procedure: 1.  Nasal Septoplasty    2.  Bilateral Inferior Turbinate Reduction  Anesthesia: GET  Surgeon: Delsa Bern, M.D.  Complications: None  EBL: 50 cc  Findings: Severely deviated nasal septum with airway obstruction and bilateral inferior turbinate hypertrophy.   Brief History: The patient is a 60 y.o. male with a history of progressive nasal airway obstruction. The patient has been on medical therapy to reduce nasal mucosal edema including saline nasal spray and topical nasal steroids. Despite appropriate medical therapy the patient continues to have ongoing symptoms. Given the patient's history and findings, the above surgical procedures were recommended, risks and benefits were discussed in detail with the patient may understand and agree with our plan for surgery which is scheduled at [facility] under general anesthesia as an outpatient.  Surgical Procedure: The patient is brought to the operating room on 10/27/2020 and placed in supine position on the operating table. General endotracheal anesthesia was established without difficulty. When the patient was adequately anesthetized, surgical timeout was performed and correct identification of the patient and the surgical procedure. The patient's nose was then injected with  5 cc of 1% lidocaine 1 100,000 dilution epinephrine which was injected in a submucosal fashion. The patient's nose was then packed with Afrin-soaked cottonoid pledgets were left in place for approximately 10 minutes lateral vasoconstriction and hemostasis.  With the patient prepped draped and prepared for surgery, nasal septoplasty was begun.  A left  anterior hemitransfixion incision was created and a mucoperichondrial flap was elevated from anterior to posterior on the left-hand side. The anterior cartilaginous septum was crossed at the midline and a mucoperichondrial flap was elevated on the patient's right.  Swivel knife was then used to resect the anterior and mid cartilaginous portion of the nasal septum.  Resected cartilage was morcellized and returned to the mucoperichondrial pocket at the occlusion of the surgical procedure.  Dissection was then carried out from anterior to posterior removing deviated bone and cartilage including a large septal spur the overlying mucosa was preserved.  With the septum brought to good midline position, the morselized cartilage was returned to the mucoperichondrial pocket and the soft tissue/mucosal flaps were reapproximated with interrupted 4-0 gut suture on a Keith needle in a horizontal mattressing fashion.  Anterior hemitransfixion incision was closed with the same stitch.  Bilateral Doyle nasal septal splints were then placed after the application of Bactroban ointment and sutured in position with a 3-0 Ethilon suture.  Attention was then turned to the inferior turbinates, bilateral inferior turbinate intramural cautery was performed with cautery setting at 64 W.  2 submucosal passes were made in each inferior turbinate.  After completing cautery, anterior vertical incisions were created and overlying soft tissue was elevated, a small amount of turbinate bone was resected.  The turbinates were then outfractured to create a more patent nasal passageway.  Surgical sponge count was correct. An oral gastric tube was passed and the stomach contents were aspirated. Patient was awakened from anesthetic and transferred from the operating room to the recovery room in stable condition. There were no complications and blood loss was 50cc.   Delsa Bern, M.D. [facility] 10/27/2020

## 2020-10-27 NOTE — Discharge Instructions (Signed)

## 2020-10-27 NOTE — Anesthesia Procedure Notes (Signed)
Procedure Name: Intubation Date/Time: 10/27/2020 7:42 AM Performed by: Lavonia Dana, CRNA Pre-anesthesia Checklist: Patient identified, Emergency Drugs available, Suction available and Patient being monitored Patient Re-evaluated:Patient Re-evaluated prior to induction Oxygen Delivery Method: Circle system utilized Preoxygenation: Pre-oxygenation with 100% oxygen Induction Type: IV induction Ventilation: Mask ventilation without difficulty and Oral airway inserted - appropriate to patient size Laryngoscope Size: Mac and 4 Grade View: Grade II Tube type: Oral Tube size: 7.5 mm Number of attempts: 1 Airway Equipment and Method: Stylet and Oral airway Placement Confirmation: ETT inserted through vocal cords under direct vision, positive ETCO2 and breath sounds checked- equal and bilateral Secured at: 24 cm Tube secured with: Tape Dental Injury: Teeth and Oropharynx as per pre-operative assessment  Comments: Grade IIb view

## 2020-10-29 ENCOUNTER — Encounter (HOSPITAL_BASED_OUTPATIENT_CLINIC_OR_DEPARTMENT_OTHER): Payer: Self-pay | Admitting: Otolaryngology

## 2021-01-25 ENCOUNTER — Other Ambulatory Visit: Payer: Self-pay | Admitting: Orthopedic Surgery

## 2021-01-25 ENCOUNTER — Other Ambulatory Visit (HOSPITAL_COMMUNITY): Payer: Self-pay | Admitting: Orthopedic Surgery

## 2021-01-25 DIAGNOSIS — Z96643 Presence of artificial hip joint, bilateral: Secondary | ICD-10-CM | POA: Diagnosis not present

## 2021-01-25 DIAGNOSIS — M25551 Pain in right hip: Secondary | ICD-10-CM

## 2021-01-25 DIAGNOSIS — M25552 Pain in left hip: Secondary | ICD-10-CM

## 2021-02-01 DIAGNOSIS — M4004 Postural kyphosis, thoracic region: Secondary | ICD-10-CM | POA: Diagnosis not present

## 2021-02-01 DIAGNOSIS — S134XXA Sprain of ligaments of cervical spine, initial encounter: Secondary | ICD-10-CM | POA: Diagnosis not present

## 2021-02-07 ENCOUNTER — Ambulatory Visit (HOSPITAL_COMMUNITY)
Admission: RE | Admit: 2021-02-07 | Discharge: 2021-02-07 | Disposition: A | Discharge status: Home / Self Care | Payer: BC Managed Care – PPO | Source: Ambulatory Visit | Attending: Orthopedic Surgery | Admitting: Orthopedic Surgery

## 2021-02-07 ENCOUNTER — Other Ambulatory Visit: Payer: Self-pay

## 2021-02-07 DIAGNOSIS — M25551 Pain in right hip: Secondary | ICD-10-CM | POA: Insufficient documentation

## 2021-02-07 DIAGNOSIS — Z96641 Presence of right artificial hip joint: Secondary | ICD-10-CM | POA: Diagnosis not present

## 2021-02-07 DIAGNOSIS — M25552 Pain in left hip: Secondary | ICD-10-CM | POA: Insufficient documentation

## 2021-02-07 DIAGNOSIS — Z96642 Presence of left artificial hip joint: Secondary | ICD-10-CM | POA: Diagnosis not present

## 2021-03-21 ENCOUNTER — Other Ambulatory Visit: Payer: BC Managed Care – PPO

## 2021-03-26 ENCOUNTER — Ambulatory Visit: Payer: BC Managed Care – PPO | Admitting: Urology

## 2021-03-29 ENCOUNTER — Ambulatory Visit: Payer: BC Managed Care – PPO | Admitting: Urology

## 2021-03-29 DIAGNOSIS — M25552 Pain in left hip: Secondary | ICD-10-CM | POA: Diagnosis not present

## 2021-03-29 DIAGNOSIS — Z96642 Presence of left artificial hip joint: Secondary | ICD-10-CM | POA: Diagnosis not present

## 2021-04-23 ENCOUNTER — Other Ambulatory Visit: Payer: BC Managed Care – PPO

## 2021-04-23 ENCOUNTER — Other Ambulatory Visit: Payer: Self-pay

## 2021-04-23 DIAGNOSIS — R972 Elevated prostate specific antigen [PSA]: Secondary | ICD-10-CM

## 2021-04-24 DIAGNOSIS — R972 Elevated prostate specific antigen [PSA]: Secondary | ICD-10-CM | POA: Diagnosis not present

## 2021-04-25 LAB — PSA: Prostate Specific Ag, Serum: 0.7 ng/mL (ref 0.0–4.0)

## 2021-04-28 DIAGNOSIS — M7072 Other bursitis of hip, left hip: Secondary | ICD-10-CM | POA: Diagnosis not present

## 2021-05-02 ENCOUNTER — Encounter: Payer: Self-pay | Admitting: Urology

## 2021-05-02 ENCOUNTER — Other Ambulatory Visit: Payer: Self-pay

## 2021-05-02 ENCOUNTER — Ambulatory Visit (HOSPITAL_COMMUNITY)
Admission: RE | Admit: 2021-05-02 | Discharge: 2021-05-02 | Disposition: A | Discharge status: Home / Self Care | Payer: BC Managed Care – PPO | Source: Ambulatory Visit | Attending: Urology | Admitting: Urology

## 2021-05-02 ENCOUNTER — Ambulatory Visit (INDEPENDENT_AMBULATORY_CARE_PROVIDER_SITE_OTHER): Payer: BC Managed Care – PPO | Admitting: Urology

## 2021-05-02 VITALS — BP 140/83 | HR 79 | Wt 220.0 lb

## 2021-05-02 DIAGNOSIS — N200 Calculus of kidney: Secondary | ICD-10-CM | POA: Diagnosis not present

## 2021-05-02 DIAGNOSIS — R972 Elevated prostate specific antigen [PSA]: Secondary | ICD-10-CM

## 2021-05-02 DIAGNOSIS — R350 Frequency of micturition: Secondary | ICD-10-CM | POA: Diagnosis not present

## 2021-05-02 DIAGNOSIS — Z87442 Personal history of urinary calculi: Secondary | ICD-10-CM | POA: Diagnosis not present

## 2021-05-02 LAB — URINALYSIS, ROUTINE W REFLEX MICROSCOPIC
Bilirubin, UA: NEGATIVE
Glucose, UA: NEGATIVE
Ketones, UA: NEGATIVE
Leukocytes,UA: NEGATIVE
Nitrite, UA: NEGATIVE
Protein,UA: NEGATIVE
Specific Gravity, UA: 1.015 (ref 1.005–1.030)
Urobilinogen, Ur: 0.2 mg/dL (ref 0.2–1.0)
pH, UA: 6 (ref 5.0–7.5)

## 2021-05-02 LAB — MICROSCOPIC EXAMINATION
Bacteria, UA: NONE SEEN
Epithelial Cells (non renal): NONE SEEN /hpf (ref 0–10)
Renal Epithel, UA: NONE SEEN /hpf
WBC, UA: NONE SEEN /hpf (ref 0–5)

## 2021-05-02 MED ORDER — ALFUZOSIN HCL ER 10 MG PO TB24: 10.0000 mg | ORAL_TABLET | Freq: Every day | ORAL | 11 refills | Status: DC

## 2021-05-02 NOTE — Progress Notes (Signed)
05/02/2021 9:09 AM   Frank Blake 1960/06/04 [id number]  Referring provider: Sharilyn Sites, MD [address] [place],  [place] [address]  Nephrolithiasis and BPH   HPI:  Frank Blake is a 60yo here for followup for elevated PSA, nephrolithiasis, and BPh with urinary frequency and nocturia. IPSS 2 QOL 1. Nocturia 2x on flomax 0.4mg  daily. PSA 0.7 up from 0.56. No stone events since last visit. KUB from today shows 40mm left mid pole calculus. No flank pain. No stone events since last visit. NO other complaints today   PMH: Past Medical History:  Diagnosis Date   Arthritis    GERD (gastroesophageal reflux disease)    Hypertension     Surgical History: Past Surgical History:  Procedure Laterality Date   COLONOSCOPY     COLONOSCOPY N/A 09/13/2014   Procedure: COLONOSCOPY;  Surgeon: Aviva Signs Md, MD;  [facility];  Service: Gastroenterology;  Laterality: N/A;   COLONOSCOPY N/A 09/28/2019   Procedure: COLONOSCOPY;  Surgeon: Aviva Signs, MD;  Location: [facility];  Service: Gastroenterology;  Laterality: N/A;   EXTRACORPOREAL SHOCK WAVE LITHOTRIPSY Bilateral    JOINT REPLACEMENT Bilateral 2007   hip   NASAL SEPTOPLASTY W/ TURBINOPLASTY Bilateral 10/27/2020   Procedure: NASAL SEPTOPLASTY WITH TURBINATE REDUCTION;  Surgeon: Jerrell Belfast, MD;  Location: [facility];  Service: ENT;  Laterality: Bilateral;    Home Medications:  Allergies as of 05/02/2021   No Known Allergies      Medication List        Accurate as of May 02, 2021  9:09 AM. If you have any questions, ask your nurse or doctor.          amitriptyline 75 MG tablet Commonly known as: ELAVIL Take 75 mg by mouth at bedtime.   aspirin 81 MG chewable tablet Chew 81 mg by mouth daily.   BABY ASPIRIN PO   Biotin 10000 MCG Tabs Take 10,000 mcg by mouth daily.   bisoprolol-hydrochlorothiazide 5-6.25 MG tablet Commonly known as: ZIAC Take 1 tablet by mouth  daily.   Multi-Vitamin tablet Take 2 tablets by mouth daily.   Omega-3 1400 MG Caps Take 1,400 mg by mouth daily.   pantoprazole 40 MG tablet Commonly known as: PROTONIX Take 40 mg by mouth daily.   tamsulosin 0.4 MG Caps capsule Commonly known as: FLOMAX Take 1 capsule (0.4 mg total) by mouth daily after supper.   Vitamin D-1000 Max St 25 MCG (1000 UT) tablet Generic drug: Cholecalciferol Take 1,000 Units by mouth daily.        Allergies: No Known Allergies  Family History: Family History  Problem Relation Age of Onset   Colon cancer Sister    Other Father        Hodgkins disease    Social History:  reports that he has never smoked. He has never used smokeless tobacco. He reports that he does not drink alcohol and does not use drugs.  ROS: All other review of systems were reviewed and are negative except what is noted above in HPI  Physical Exam: BP 140/83    Pulse 79    Wt 220 lb (99.8 kg)    BMI 31.57 kg/m   Constitutional:  Alert and oriented, No acute distress. HEENT: [place] AT, moist mucus membranes.  Trachea midline, no masses. Cardiovascular: No clubbing, cyanosis, or edema. Respiratory: Normal respiratory effort, no increased work of breathing. GI: Abdomen is soft, nontender, nondistended, no abdominal masses GU: No CVA tenderness.  Lymph: No cervical or inguinal lymphadenopathy. Skin: No rashes, bruises or suspicious lesions. Neurologic: Grossly intact, no focal deficits, moving all 4 extremities. Psychiatric: Normal mood and affect.  Laboratory Data: Lab Results  Component Value Date   WBC 11.6 (H) 02/14/2010   HGB 13.2 02/14/2010   HCT 38.9 (L) 02/14/2010   MCV 85.0 02/14/2010   PLT 181 02/14/2010    Lab Results  Component Value Date   CREATININE 1.04 10/23/2020    No results found for: PSA  No results found for: TESTOSTERONE  No results found for: HGBA1C  Urinalysis    Component Value Date/Time   COLORURINE STRAW (A) 05/09/2017  1045   APPEARANCEUR Clear 03/28/2020 1458   LABSPEC 1.005 05/09/2017 1045   PHURINE 6.0 05/09/2017 1045   GLUCOSEU Negative 03/28/2020 1458   HGBUR NEGATIVE 05/09/2017 1045   BILIRUBINUR Negative 03/28/2020 [place] 05/09/2017 1045   PROTEINUR Negative 03/28/2020 1458   PROTEINUR NEGATIVE 05/09/2017 1045   UROBILINOGEN 0.2 02/02/2010 0924   NITRITE Negative 03/28/2020 1458   NITRITE NEGATIVE 05/09/2017 1045   LEUKOCYTESUR Trace (A) 03/28/2020 1458    Lab Results  Component Value Date   LABMICR See below: 03/28/2020   WBCUA 0-5 03/28/2020   LABEPIT 0-10 03/28/2020   BACTERIA None seen 03/28/2020    Pertinent Imaging: KUB today: Images reviewed and discussed with the patient Results for orders placed in visit on 03/28/20  Abdomen 1 view (KUB)  Narrative CLINICAL DATA:  History of left-sided kidney stone.  EXAM: ABDOMEN - 1 VIEW  COMPARISON:  Radiograph, 10/04/2009.  CT, 07/28/2005.  FINDINGS: Small stone projects in the lower pole of the left kidney.  Small phlebolith in the right inferior pelvis. There is a tiny density adjacent to this that is also likely a phlebolith, and is stable from the prior radiograph. No convincing ureteral stone. Soft tissues are otherwise unremarkable.  Normal bowel gas pattern.  Partly imaged bilateral total hip arthroplasties appear well seated and aligned. No acute skeletal abnormality.  IMPRESSION: 1. No acute findings.  No evidence of a ureteral stone. 2. Small stone projects in the lower pole the left kidney. No other intrarenal stones.   Electronically Signed By: Lajean Manes M.D. On: 03/29/2020 15:02  No results found for this or any previous visit.  No results found for this or any previous visit.  No results found for this or any previous visit.  No results found for this or any previous visit.  No results found for this or any previous visit.  No results found for this or any previous  visit.  No results found for this or any previous visit.   Assessment & Plan:    1. Urinary frequency/ nocturia We will trial uroxatral 10mg  for 1 month, if this fails to improve his nocturia we will restart flomax 0.4mg  daily. - Urinalysis, Routine w reflex microscopic  2. Elevated PSA -RTC 1 year with PSA - Urinalysis, Routine w reflex microscopic  3. Nephrolithiasis -RTC 1 year with KUB   No follow-ups on file.  Nicolette Bang, MD  [facility] [place]

## 2021-05-02 NOTE — Progress Notes (Signed)
Urological Symptom Review  Patient is experiencing the following symptoms: Get up at night to urinate   Review of Systems  Gastrointestinal (upper)  : Indigestion/heartburn  Gastrointestinal (lower) : Negative for lower GI symptoms  Constitutional : Negative for symptoms  Skin: Negative for skin symptoms  Eyes: Negative for eye symptoms  Ear/Nose/Throat : Negative for Ear/Nose/Throat symptoms  Hematologic/Lymphatic: Negative for Hematologic/Lymphatic symptoms  Cardiovascular : Negative for cardiovascular symptoms  Respiratory : Negative for respiratory symptoms  Endocrine: Negative for endocrine symptoms  Musculoskeletal: Negative for musculoskeletal symptoms  Neurological: Negative for neurological symptoms  Psychologic: Negative for psychiatric symptoms

## 2021-05-02 NOTE — Patient Instructions (Signed)
Dietary Guidelines to Help Prevent Kidney Stones Kidney stones are deposits of minerals and salts that form inside your kidneys. Your risk of developing kidney stones may be greater depending on your diet, your lifestyle, the medicines you take, and whether you have certain medical conditions. Most people can lower their chances of developing kidney stones by following the instructions below. Your dietitian may give you more specific instructions depending on your overall health and the type of kidney stones you tend to develop. What are tips for following this plan? Reading food labels  Choose foods with "no salt added" or "low-salt" labels. Limit your salt (sodium) intake to less than 1,500 mg a day. Choose foods with calcium for each meal and snack. Try to eat about 300 mg of calcium at each meal. Foods that contain 200-500 mg of calcium a serving include: 8 oz (237 mL) of milk, calcium-fortifiednon-dairy milk, and calcium-fortifiedfruit juice. Calcium-fortified means that calcium has been added to these drinks. 8 oz (237 mL) of kefir, yogurt, and soy yogurt. 4 oz (114 g) of tofu. 1 oz (28 g) of cheese. 1 cup (150 g) of dried figs. 1 cup (91 g) of cooked broccoli. One 3 oz (85 g) can of sardines or mackerel. Most people need 1,000-1,500 mg of calcium a day. Talk to your dietitian about how much calcium is recommended for you. Shopping Buy plenty of fresh fruits and vegetables. Most people do not need to avoid fruits and vegetables, even if these foods contain nutrients that may contribute to kidney stones. When shopping for convenience foods, choose: Whole pieces of fruit. Pre-made salads with dressing on the side. Low-fat fruit and yogurt smoothies. Avoid buying frozen meals or prepared deli foods. These can be high in sodium. Look for foods with live cultures, such as yogurt and kefir. Choose high-fiber grains, such as whole-wheat breads, oat bran, and wheat cereals. Cooking Do not add  salt to food when cooking. Place a salt shaker on the table and allow each person to add his or her own salt to taste. Use vegetable protein, such as beans, textured vegetable protein (TVP), or tofu, instead of meat in pasta, casseroles, and soups. Meal planning Eat less salt, if told by your dietitian. To do this: Avoid eating processed or pre-made food. Avoid eating fast food. Eat less animal protein, including cheese, meat, poultry, or fish, if told by your dietitian. To do this: Limit the number of times you have meat, poultry, fish, or cheese each week. Eat a diet free of meat at least 2 days a week. Eat only one serving each day of meat, poultry, fish, or seafood. When you prepare animal protein, cut pieces into small portion sizes. For most meat and fish, one serving is about the size of the palm of your hand. Eat at least five servings of fresh fruits and vegetables each day. To do this: Keep fruits and vegetables on hand for snacks. Eat one piece of fruit or a handful of berries with breakfast. Have a salad and fruit at lunch. Have two kinds of vegetables at dinner. Limit foods that are high in a substance called oxalate. These include: Spinach (cooked), rhubarb, beets, sweet potatoes, and Swiss chard. Peanuts. Potato chips, french fries, and baked potatoes with skin on. Nuts and nut products. Chocolate. If you regularly take a diuretic medicine, make sure to eat at least 1 or 2 servings of fruits or vegetables that are high in potassium each day. These include: Avocado. Banana. Orange, prune,   carrot, or tomato juice. Baked potato. Cabbage. Beans and split peas. Lifestyle  Drink enough fluid to keep your urine pale yellow. This is the most important thing you can do. Spread your fluid intake throughout the day. If you drink alcohol: Limit how much you use to: 0-1 drink a day for women who are not pregnant. 0-2 drinks a day for men. Be aware of how much alcohol is in your  drink. In the [place] one drink equals one 12 oz bottle of beer (355 mL), one 5 oz glass of wine (148 mL), or one 1 oz glass of hard liquor (44 mL). Lose weight if told by your health care provider. Work with your dietitian to find an eating plan and weight loss strategies that work best for you. General information Talk to your health care provider and dietitian about taking daily supplements. You may be told the following depending on your health and the cause of your kidney stones: Not to take supplements with vitamin C. To take a calcium supplement. To take a daily probiotic supplement. To take other supplements such as magnesium, fish oil, or vitamin B6. Take over-the-counter and prescription medicines only as told by your health care provider. These include supplements. What foods should I limit? Limit your intake of the following foods, or eat them as told by your dietitian. Vegetables Spinach. Rhubarb. Beets. Canned vegetables. Pickles. Olives. Baked potatoes with skin. Grains Wheat bran. Baked goods. Salted crackers. Cereals high in sugar. Meats and other proteins Nuts. Nut butters. Large portions of meat, poultry, or fish. Salted, precooked, or cured meats, such as sausages, meat loaves, and hot dogs. Dairy Cheese. Beverages Regular soft drinks. Regular vegetable juice. Seasonings and condiments Seasoning blends with salt. Salad dressings. Soy sauce. Ketchup. Barbecue sauce. Other foods Canned soups. Canned pasta sauce. Casseroles. Pizza. Lasagna. Frozen meals. Potato chips. French fries. The items listed above may not be a complete list of foods and beverages you should limit. Contact a dietitian for more information. What foods should I avoid? Talk to your dietitian about specific foods you should avoid based on the type of kidney stones you have and your overall health. Fruits Grapefruit. The item listed above may not be a complete list of foods and beverages you should  avoid. Contact a dietitian for more information. Summary Kidney stones are deposits of minerals and salts that form inside your kidneys. You can lower your risk of kidney stones by making changes to your diet. The most important thing you can do is drink enough fluid. Drink enough fluid to keep your urine pale yellow. Talk to your dietitian about how much calcium you should have each day, and eat less salt and animal protein as told by your dietitian. This information is not intended to replace advice given to you by your health care provider. Make sure you discuss any questions you have with your health care provider. Document Revised: 04/22/2019 Document Reviewed: 04/22/2019 Elsevier Patient Education  2022 Elsevier Inc.  

## 2021-05-09 NOTE — Progress Notes (Signed)
Results printed and mailed.   

## 2021-05-14 DIAGNOSIS — S134XXA Sprain of ligaments of cervical spine, initial encounter: Secondary | ICD-10-CM | POA: Diagnosis not present

## 2021-05-14 DIAGNOSIS — M4004 Postural kyphosis, thoracic region: Secondary | ICD-10-CM | POA: Diagnosis not present

## 2021-09-12 DIAGNOSIS — Z8371 Family history of colonic polyps: Secondary | ICD-10-CM | POA: Diagnosis not present

## 2021-09-12 DIAGNOSIS — R7309 Other abnormal glucose: Secondary | ICD-10-CM | POA: Diagnosis not present

## 2021-09-12 DIAGNOSIS — Z1331 Encounter for screening for depression: Secondary | ICD-10-CM | POA: Diagnosis not present

## 2021-09-12 DIAGNOSIS — M1991 Primary osteoarthritis, unspecified site: Secondary | ICD-10-CM | POA: Diagnosis not present

## 2021-09-12 DIAGNOSIS — I10 Essential (primary) hypertension: Secondary | ICD-10-CM | POA: Diagnosis not present

## 2021-09-12 DIAGNOSIS — E6609 Other obesity due to excess calories: Secondary | ICD-10-CM | POA: Diagnosis not present

## 2021-09-12 DIAGNOSIS — S134XXA Sprain of ligaments of cervical spine, initial encounter: Secondary | ICD-10-CM | POA: Diagnosis not present

## 2021-09-12 DIAGNOSIS — Z0000 Encounter for general adult medical examination without abnormal findings: Secondary | ICD-10-CM | POA: Diagnosis not present

## 2021-09-12 DIAGNOSIS — N200 Calculus of kidney: Secondary | ICD-10-CM | POA: Diagnosis not present

## 2021-09-12 DIAGNOSIS — Z6832 Body mass index (BMI) 32.0-32.9, adult: Secondary | ICD-10-CM | POA: Diagnosis not present

## 2021-09-12 DIAGNOSIS — M4004 Postural kyphosis, thoracic region: Secondary | ICD-10-CM | POA: Diagnosis not present

## 2021-10-31 ENCOUNTER — Encounter: Payer: Self-pay | Admitting: Dermatology

## 2021-10-31 ENCOUNTER — Ambulatory Visit: Payer: BC Managed Care – PPO | Admitting: Dermatology

## 2021-10-31 DIAGNOSIS — L918 Other hypertrophic disorders of the skin: Secondary | ICD-10-CM | POA: Diagnosis not present

## 2021-10-31 DIAGNOSIS — B079 Viral wart, unspecified: Secondary | ICD-10-CM | POA: Diagnosis not present

## 2021-10-31 DIAGNOSIS — L738 Other specified follicular disorders: Secondary | ICD-10-CM | POA: Diagnosis not present

## 2021-10-31 DIAGNOSIS — L821 Other seborrheic keratosis: Secondary | ICD-10-CM | POA: Diagnosis not present

## 2021-10-31 DIAGNOSIS — D485 Neoplasm of uncertain behavior of skin: Secondary | ICD-10-CM | POA: Diagnosis not present

## 2021-10-31 DIAGNOSIS — Z1283 Encounter for screening for malignant neoplasm of skin: Secondary | ICD-10-CM | POA: Diagnosis not present

## 2021-10-31 DIAGNOSIS — D1800 Hemangioma unspecified site: Secondary | ICD-10-CM | POA: Diagnosis not present

## 2021-10-31 DIAGNOSIS — L720 Epidermal cyst: Secondary | ICD-10-CM

## 2021-10-31 NOTE — Patient Instructions (Signed)

## 2021-11-27 ENCOUNTER — Encounter: Payer: Self-pay | Admitting: Dermatology

## 2021-11-27 NOTE — Progress Notes (Signed)
   New Patient   Subjective  Frank Blake is a 61 y.o. male who presents for the following: Annual Exam (Right arm x 2- lesions growing, wants spots checked on right arm, right side and right leg as well).  General skin examination, several spots of concern Location:  Duration:  Quality:  Associated Signs/Symptoms: Modifying Factors:  Severity:  Timing: Context:    The following portions of the chart were reviewed this encounter and updated as appropriate:  Tobacco  Allergies  Meds  Problems  Med Hx  Surg Hx  Fam Hx      Objective  Well appearing patient in no apparent distress; mood and affect are within normal limits. Full body skin examination: No atypical pigmented lesions (all checked with dermoscopy).  2 possible nonmelanoma skin cancers on arm and leg will be biopsied.  Multiple 1 mm smooth red dermal papules  Multiple noninflamed 3 to 8 mm brown flattopped papules  Right Forearm - Posterior Verrucous 5 mm crust       Right Hip Pearly 5 mm papule       Mid Forehead Several 2 mm flesh-colored papules with eccentric dell, compatible dermoscopy  Left Lower Eyelid Half millimeter upper dermal white papule    A full examination was performed including scalp, head, eyes, ears, nose, lips, neck, chest, axillae, abdomen, back, buttocks, bilateral upper extremities, bilateral lower extremities, hands, feet, fingers, toes, fingernails, and toenails. All findings within normal limits unless otherwise noted below.   Assessment & Plan  Encounter for screening for malignant neoplasm of skin  Annual skin examination, encouraged to self examine with spouse twice annually.  Continue ultraviolet protection.  Neoplasm of uncertain behavior of skin (2) Right Forearm - Posterior  Skin / nail biopsy Type of biopsy: tangential   Informed consent: discussed and consent obtained   Timeout: patient name, date of birth, surgical site, and procedure verified    Procedure prep:  Patient was prepped and draped in usual sterile fashion (Non sterile) Prep type:  Chlorhexidine Anesthesia: the lesion was anesthetized in a standard fashion   Anesthetic:  1% lidocaine w/ epinephrine 1-100,000 local infiltration Instrument used: flexible razor blade   Outcome: patient tolerated procedure well   Post-procedure details: wound care instructions given    Specimen 1 - Surgical pathology Differential Diagnosis: bcc vs scc  Check Margins: No  Right Hip  Skin / nail biopsy Type of biopsy: tangential   Informed consent: discussed and consent obtained   Timeout: patient name, date of birth, surgical site, and procedure verified   Procedure prep:  Patient was prepped and draped in usual sterile fashion (Non sterile) Prep type:  Chlorhexidine Anesthesia: the lesion was anesthetized in a standard fashion   Anesthetic:  1% lidocaine w/ epinephrine 1-100,000 local infiltration Instrument used: flexible razor blade   Outcome: patient tolerated procedure well   Post-procedure details: wound care instructions given    Specimen 2 - Surgical pathology Differential Diagnosis: bcc vs scc  Check Margins: No  Hemangioma, unspecified site  No intervention necessary  Seborrheic keratosis  Leave if stable  Sebaceous hyperplasia of face Mid Forehead  Told of similar appearance of early BCC so if any lesion grows or bleeds return for biopsy  Milia Left Lower Eyelid  No intervention necessary

## 2021-12-24 DIAGNOSIS — M4004 Postural kyphosis, thoracic region: Secondary | ICD-10-CM | POA: Diagnosis not present

## 2021-12-24 DIAGNOSIS — S134XXA Sprain of ligaments of cervical spine, initial encounter: Secondary | ICD-10-CM | POA: Diagnosis not present

## 2022-02-02 IMAGING — DX DG ABDOMEN 1V
2 series · 2 of 2 positions shown · non-contrast
Comparison: 03/28/2020 CT urogram 07/28/2005

CLINICAL DATA: History of left-sided kidney stone.

EXAM:
ABDOMEN - 1 VIEW

[abdomen kub (1 of 2)]
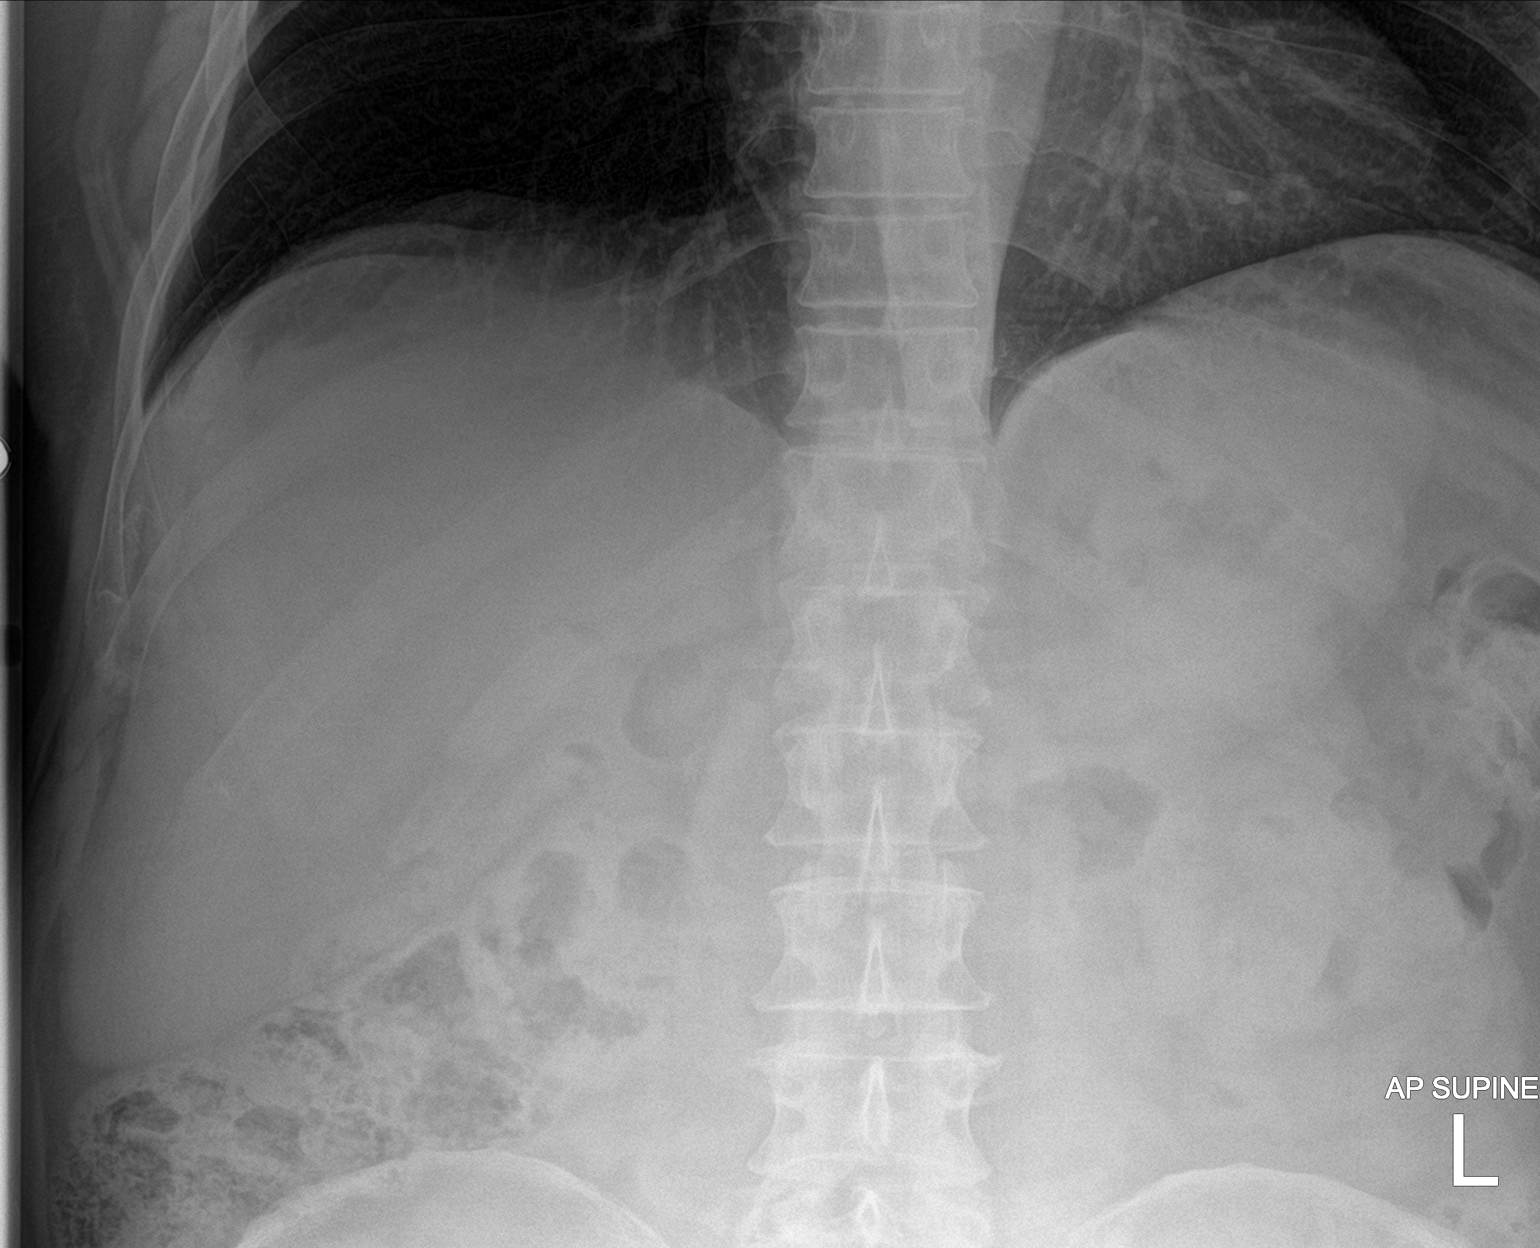

[abdomen kub (2 of 2)]
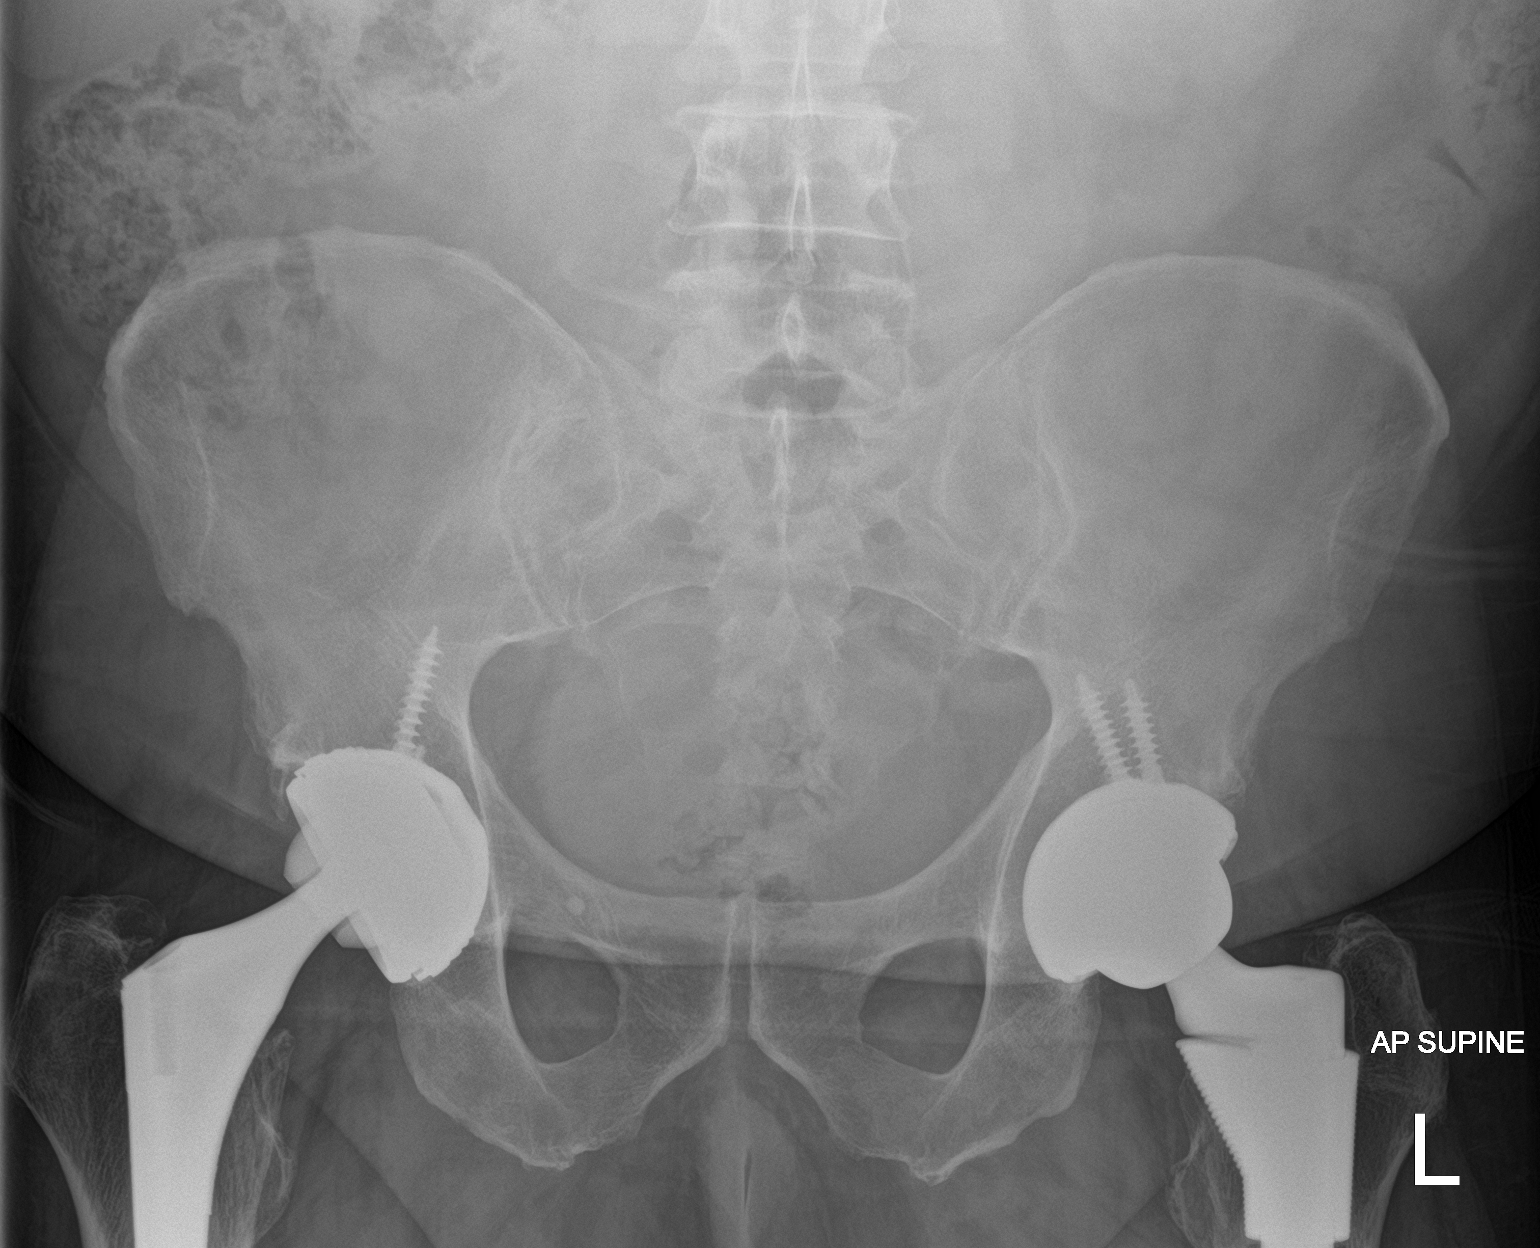

[2 of 2 positions shown; findings below may reference images not displayed]

FINDINGS: Bowel gas pattern is nonobstructive with mild fecal retention
throughout the colon. No definite stones project over the kidneys.
Previously seen stone over the lower pole left kidney not
visualized. Remainder the exam is unchanged.
IMPRESSION: 1. Nonobstructive bowel gas pattern.
2. No definite renal/ureteral stones.

## 2022-02-19 DIAGNOSIS — L814 Other melanin hyperpigmentation: Secondary | ICD-10-CM | POA: Diagnosis not present

## 2022-02-19 DIAGNOSIS — D1801 Hemangioma of skin and subcutaneous tissue: Secondary | ICD-10-CM | POA: Diagnosis not present

## 2022-02-19 DIAGNOSIS — L820 Inflamed seborrheic keratosis: Secondary | ICD-10-CM | POA: Diagnosis not present

## 2022-02-19 DIAGNOSIS — L821 Other seborrheic keratosis: Secondary | ICD-10-CM | POA: Diagnosis not present

## 2022-02-19 DIAGNOSIS — L298 Other pruritus: Secondary | ICD-10-CM | POA: Diagnosis not present

## 2022-02-19 DIAGNOSIS — R208 Other disturbances of skin sensation: Secondary | ICD-10-CM | POA: Diagnosis not present

## 2022-02-19 DIAGNOSIS — L538 Other specified erythematous conditions: Secondary | ICD-10-CM | POA: Diagnosis not present

## 2022-04-01 DIAGNOSIS — M4004 Postural kyphosis, thoracic region: Secondary | ICD-10-CM | POA: Diagnosis not present

## 2022-04-01 DIAGNOSIS — S134XXA Sprain of ligaments of cervical spine, initial encounter: Secondary | ICD-10-CM | POA: Diagnosis not present

## 2022-04-05 DIAGNOSIS — H40033 Anatomical narrow angle, bilateral: Secondary | ICD-10-CM | POA: Diagnosis not present

## 2022-04-05 DIAGNOSIS — H04123 Dry eye syndrome of bilateral lacrimal glands: Secondary | ICD-10-CM | POA: Diagnosis not present

## 2022-04-24 ENCOUNTER — Other Ambulatory Visit: Payer: BC Managed Care – PPO

## 2022-05-01 ENCOUNTER — Ambulatory Visit: Payer: BC Managed Care – PPO | Admitting: Urology

## 2022-06-24 DIAGNOSIS — M4004 Postural kyphosis, thoracic region: Secondary | ICD-10-CM | POA: Diagnosis not present

## 2022-06-24 DIAGNOSIS — S134XXA Sprain of ligaments of cervical spine, initial encounter: Secondary | ICD-10-CM | POA: Diagnosis not present

## 2022-07-09 DIAGNOSIS — R350 Frequency of micturition: Secondary | ICD-10-CM | POA: Diagnosis not present

## 2022-07-09 DIAGNOSIS — N390 Urinary tract infection, site not specified: Secondary | ICD-10-CM | POA: Diagnosis not present

## 2022-07-09 DIAGNOSIS — Z6831 Body mass index (BMI) 31.0-31.9, adult: Secondary | ICD-10-CM | POA: Diagnosis not present

## 2022-07-09 DIAGNOSIS — E669 Obesity, unspecified: Secondary | ICD-10-CM | POA: Diagnosis not present

## 2022-08-01 DIAGNOSIS — B356 Tinea cruris: Secondary | ICD-10-CM | POA: Diagnosis not present

## 2022-08-01 DIAGNOSIS — L814 Other melanin hyperpigmentation: Secondary | ICD-10-CM | POA: Diagnosis not present

## 2022-08-01 DIAGNOSIS — L821 Other seborrheic keratosis: Secondary | ICD-10-CM | POA: Diagnosis not present

## 2022-08-02 DIAGNOSIS — Z87442 Personal history of urinary calculi: Secondary | ICD-10-CM | POA: Diagnosis not present

## 2022-08-02 DIAGNOSIS — N401 Enlarged prostate with lower urinary tract symptoms: Secondary | ICD-10-CM | POA: Diagnosis not present

## 2022-08-02 DIAGNOSIS — R351 Nocturia: Secondary | ICD-10-CM | POA: Diagnosis not present

## 2022-08-02 DIAGNOSIS — Z125 Encounter for screening for malignant neoplasm of prostate: Secondary | ICD-10-CM | POA: Diagnosis not present

## 2022-09-16 DIAGNOSIS — S134XXA Sprain of ligaments of cervical spine, initial encounter: Secondary | ICD-10-CM | POA: Diagnosis not present

## 2022-09-16 DIAGNOSIS — M4004 Postural kyphosis, thoracic region: Secondary | ICD-10-CM | POA: Diagnosis not present

## 2022-10-14 DIAGNOSIS — R7309 Other abnormal glucose: Secondary | ICD-10-CM | POA: Diagnosis not present

## 2022-10-14 DIAGNOSIS — E663 Overweight: Secondary | ICD-10-CM | POA: Diagnosis not present

## 2022-10-14 DIAGNOSIS — Z6829 Body mass index (BMI) 29.0-29.9, adult: Secondary | ICD-10-CM | POA: Diagnosis not present

## 2022-10-14 DIAGNOSIS — I10 Essential (primary) hypertension: Secondary | ICD-10-CM | POA: Diagnosis not present

## 2022-10-25 DIAGNOSIS — E663 Overweight: Secondary | ICD-10-CM | POA: Diagnosis not present

## 2022-10-25 DIAGNOSIS — Z6829 Body mass index (BMI) 29.0-29.9, adult: Secondary | ICD-10-CM | POA: Diagnosis not present

## 2022-10-25 DIAGNOSIS — M21621 Bunionette of right foot: Secondary | ICD-10-CM | POA: Diagnosis not present

## 2022-11-04 DIAGNOSIS — S93331D Other subluxation of right foot, subsequent encounter: Secondary | ICD-10-CM | POA: Diagnosis not present

## 2022-11-04 DIAGNOSIS — M79671 Pain in right foot: Secondary | ICD-10-CM | POA: Diagnosis not present

## 2022-11-04 DIAGNOSIS — M779 Enthesopathy, unspecified: Secondary | ICD-10-CM | POA: Diagnosis not present

## 2022-11-18 DIAGNOSIS — M79674 Pain in right toe(s): Secondary | ICD-10-CM | POA: Diagnosis not present

## 2022-11-18 DIAGNOSIS — M25774 Osteophyte, right foot: Secondary | ICD-10-CM | POA: Diagnosis not present

## 2022-11-18 DIAGNOSIS — M79671 Pain in right foot: Secondary | ICD-10-CM | POA: Diagnosis not present

## 2022-11-18 DIAGNOSIS — L110 Acquired keratosis follicularis: Secondary | ICD-10-CM | POA: Diagnosis not present

## 2022-12-03 DIAGNOSIS — M79671 Pain in right foot: Secondary | ICD-10-CM | POA: Diagnosis not present

## 2022-12-03 DIAGNOSIS — M79674 Pain in right toe(s): Secondary | ICD-10-CM | POA: Diagnosis not present

## 2022-12-09 DIAGNOSIS — M7751 Other enthesopathy of right foot: Secondary | ICD-10-CM | POA: Diagnosis not present

## 2022-12-09 DIAGNOSIS — M2010 Hallux valgus (acquired), unspecified foot: Secondary | ICD-10-CM | POA: Diagnosis not present

## 2022-12-09 DIAGNOSIS — M79671 Pain in right foot: Secondary | ICD-10-CM | POA: Diagnosis not present

## 2022-12-11 DIAGNOSIS — M79671 Pain in right foot: Secondary | ICD-10-CM | POA: Diagnosis not present

## 2022-12-24 DIAGNOSIS — M79671 Pain in right foot: Secondary | ICD-10-CM | POA: Diagnosis not present

## 2023-02-13 DIAGNOSIS — S134XXA Sprain of ligaments of cervical spine, initial encounter: Secondary | ICD-10-CM | POA: Diagnosis not present

## 2023-02-13 DIAGNOSIS — M4004 Postural kyphosis, thoracic region: Secondary | ICD-10-CM | POA: Diagnosis not present

## 2023-03-04 DIAGNOSIS — I10 Essential (primary) hypertension: Secondary | ICD-10-CM | POA: Diagnosis not present

## 2023-03-04 DIAGNOSIS — Z0000 Encounter for general adult medical examination without abnormal findings: Secondary | ICD-10-CM | POA: Diagnosis not present

## 2023-03-04 DIAGNOSIS — R7309 Other abnormal glucose: Secondary | ICD-10-CM | POA: Diagnosis not present

## 2023-03-04 DIAGNOSIS — E6609 Other obesity due to excess calories: Secondary | ICD-10-CM | POA: Diagnosis not present

## 2023-03-04 DIAGNOSIS — Z1389 Encounter for screening for other disorder: Secondary | ICD-10-CM | POA: Diagnosis not present

## 2023-03-04 DIAGNOSIS — Z1331 Encounter for screening for depression: Secondary | ICD-10-CM | POA: Diagnosis not present

## 2023-03-04 DIAGNOSIS — Z6829 Body mass index (BMI) 29.0-29.9, adult: Secondary | ICD-10-CM | POA: Diagnosis not present

## 2023-05-31 DIAGNOSIS — N3001 Acute cystitis with hematuria: Secondary | ICD-10-CM | POA: Diagnosis not present

## 2023-05-31 DIAGNOSIS — R030 Elevated blood-pressure reading, without diagnosis of hypertension: Secondary | ICD-10-CM | POA: Diagnosis not present

## 2023-05-31 DIAGNOSIS — Z6829 Body mass index (BMI) 29.0-29.9, adult: Secondary | ICD-10-CM | POA: Diagnosis not present

## 2023-05-31 DIAGNOSIS — E663 Overweight: Secondary | ICD-10-CM | POA: Diagnosis not present

## 2023-06-19 DIAGNOSIS — J069 Acute upper respiratory infection, unspecified: Secondary | ICD-10-CM | POA: Diagnosis not present

## 2023-06-19 DIAGNOSIS — R509 Fever, unspecified: Secondary | ICD-10-CM | POA: Diagnosis not present

## 2023-06-21 DIAGNOSIS — Z6828 Body mass index (BMI) 28.0-28.9, adult: Secondary | ICD-10-CM | POA: Diagnosis not present

## 2023-06-21 DIAGNOSIS — E663 Overweight: Secondary | ICD-10-CM | POA: Diagnosis not present

## 2023-06-21 DIAGNOSIS — J019 Acute sinusitis, unspecified: Secondary | ICD-10-CM | POA: Diagnosis not present

## 2023-06-27 DIAGNOSIS — J9801 Acute bronchospasm: Secondary | ICD-10-CM | POA: Diagnosis not present

## 2023-06-27 DIAGNOSIS — J40 Bronchitis, not specified as acute or chronic: Secondary | ICD-10-CM | POA: Diagnosis not present

## 2023-06-27 DIAGNOSIS — R059 Cough, unspecified: Secondary | ICD-10-CM | POA: Diagnosis not present

## 2023-06-27 DIAGNOSIS — R062 Wheezing: Secondary | ICD-10-CM | POA: Diagnosis not present

## 2023-07-25 DIAGNOSIS — M25552 Pain in left hip: Secondary | ICD-10-CM | POA: Diagnosis not present

## 2023-07-25 DIAGNOSIS — M25551 Pain in right hip: Secondary | ICD-10-CM | POA: Diagnosis not present

## 2023-07-25 DIAGNOSIS — Z96642 Presence of left artificial hip joint: Secondary | ICD-10-CM | POA: Diagnosis not present

## 2023-08-04 DIAGNOSIS — Z125 Encounter for screening for malignant neoplasm of prostate: Secondary | ICD-10-CM | POA: Diagnosis not present

## 2023-08-04 DIAGNOSIS — R351 Nocturia: Secondary | ICD-10-CM | POA: Diagnosis not present

## 2023-08-04 DIAGNOSIS — N401 Enlarged prostate with lower urinary tract symptoms: Secondary | ICD-10-CM | POA: Diagnosis not present

## 2023-08-22 DIAGNOSIS — M25552 Pain in left hip: Secondary | ICD-10-CM | POA: Diagnosis not present

## 2023-08-22 DIAGNOSIS — Z96642 Presence of left artificial hip joint: Secondary | ICD-10-CM | POA: Diagnosis not present

## 2023-08-22 DIAGNOSIS — M25551 Pain in right hip: Secondary | ICD-10-CM | POA: Diagnosis not present

## 2024-03-23 NOTE — Progress Notes (Signed)
 [facility] [place]  New Patient Visit Frank Blake Mickey. DOB: 1960/12/19  MRN: [id number] Visit Date: 03/23/2024  Encounter Provider: Bernardino JAYSON Level, FNP  Subjective:   Frank Blake is a 63 y.o. male who presents to establish care in the practice.  Issues today include:  History of Present Illness The patient is a 63 year old male who presents to establish care in the practice. He has a history of hypertension, BPH, and insomnia.  HTN: Hypertension is currently well-managed with bisoprolol 5 mg daily. He reports maintaining a healthy lifestyle, including regular exercise on the treadmill, and is committed to weight management.  Denies cardiac sxs.  BP Readings from Last 3 Encounters:  03/23/24 135/65  12/27/20 122/75  11/14/20 125/74    BPH: He underwent hip replacement surgeries in 2007 and 2014, which resulted in urinary complications. Consequently, he was prescribed tamsulosin  0.4 mg, which he takes every morning. He is having to urinate 2 to 3 times per night. He is under the care of a urologist in [place], who has confirmed satisfactory prostate health.  INSOMNIA: He has previously tried Ambien for sleep but discontinued its use due to adverse effects. Over-the-counter sleep aids have proven ineffective. Current sleep duration is approximately 3 to 4 hours per night, primarily interrupted by the need to urinate. He has not yet tried trazodone.  OSA: He has a history of obstructive sleep apnea and was considering CPAP therapy but did not pursue it due to fear. He believes that weight control could potentially alleviate his sleep apnea symptoms.  Occupation: Former consulting civil engineer, currently seeking employment Diet: Reports needing to get back on track with diet, previously worked out on the treadmill Alcohol: Reports no alcohol consumption Tobacco: Reports no tobacco consumption Sleep: Reports sleeping 3 to 4 hours per night, interrupted by  nocturia  PAST SURGICAL HISTORY: Hip replacement surgeries in 2007 and 2014 Kidney stones, successfully treated   Medical History[1] Family History[2] Social History[3] Surgical History[4] Current Medications[5] Allergies[6] Patient Active Problem List   Diagnosis Date Noted   Gastroesophageal reflux disease without esophagitis 09/26/2020   Nasal turbinate hypertrophy 03/30/2019   Deviated septum 03/30/2019   Snoring 03/30/2019   Obstructive sleep apnea 03/30/2019      Review of Systems Review of Systems  Constitutional:  Negative for activity change, appetite change, chills, diaphoresis, fatigue and fever.  HENT:  Negative for congestion, ear pain, hearing loss, sinus pressure, sneezing, sore throat and trouble swallowing.   Eyes:  Negative for photophobia and visual disturbance.  Respiratory:  Negative for cough, chest tightness, shortness of breath and wheezing.   Cardiovascular:  Negative for chest pain, palpitations and leg swelling.  Gastrointestinal:  Negative for abdominal distention, abdominal pain, constipation, diarrhea, nausea and vomiting.  Endocrine: Negative for cold intolerance and heat intolerance.  Genitourinary:  Negative for difficulty urinating, dysuria and hematuria.       +NOCTURIA  Musculoskeletal:  Negative for arthralgias, back pain, joint swelling and myalgias.  Neurological:  Negative for dizziness, light-headedness, numbness and headaches.  Hematological:  Negative for adenopathy. Does not bruise/bleed easily.  Psychiatric/Behavioral:  Positive for sleep disturbance. Negative for agitation, behavioral problems, confusion and decreased concentration. The patient is not nervous/anxious.      Patient Active Problem List   Diagnosis Date Noted    Gastroesophageal reflux disease without esophagitis 09/26/2020   Nasal turbinate hypertrophy 03/30/2019   Deviated septum 03/30/2019   Snoring 03/30/2019   Obstructive sleep apnea 03/30/2019     Resolved  Problems  No resolved problems to display.    Medical History[7]  Surgical History[8]    Allergies[9]    Social History   Social History Narrative   Not on file    Family History[10]    Objective:  BP 135/65   Pulse 64   Temp 98.6 F (37 C)   Resp 17   Ht 1.778 m (5' 10)   Wt 102 kg (223 lb 12.8 oz)   SpO2 99%   BMI 32.11 kg/m   Physical Exam Physical Exam Constitutional:      Appearance: Normal appearance. He is obese.  HENT:     Head: Normocephalic and atraumatic.  Cardiovascular:     Rate and Rhythm: Normal rate and regular rhythm.     Pulses: Normal pulses.     Heart sounds: Normal heart sounds.  Pulmonary:     Effort: Pulmonary effort is normal. No respiratory distress.     Breath sounds: Normal breath sounds. No wheezing.  Abdominal:     General: Abdomen is flat. Bowel sounds are normal. There is no distension.     Palpations: Abdomen is soft.     Tenderness: There is no abdominal tenderness. There is no guarding.  Musculoskeletal:        General: Normal range of motion.     Cervical back: Normal range of motion and neck supple.  Skin:    General: Skin is warm and dry.  Neurological:     General: No focal deficit present.     Mental Status: He is alert and oriented to person, place, and time. Mental status is at baseline.  Psychiatric:        Mood and Affect: Mood normal.        Behavior: Behavior normal.        Labs: No results found for this or any previous visit (from the past week).  Assessment/Plan:   Assessment & Plan 1. Hypertension: - Blood pressure is well-controlled on bisoprolol 5 mg daily. - A prescription refill for bisoprolol 5 mg will be provided and sent to pharmacy.  2. Urinary frequency: - Experiences nocturia, waking up 2 to 3 times per night to urinate. - Advised to discuss the potential addition of finasteride with his urologist to help manage symptoms. - A prescription refill for tamsulosin   0.4 mg will be provided and sent to pharmacy.  3. Insomnia: - Reports difficulty sleeping, getting only 3 to 4 hours of sleep per night, primarily due to nocturia. - A prescription for trazodone 50 mg will be provided, with instructions to take half a tablet (25 mg) at bedtime. If the initial dose is ineffective, he may increase to a full tablet (50 mg). The prescription will be sent to pharmacy.  4. Obstructive sleep apnea: - Has a history of obstructive sleep apnea and was considering CPAP therapy but did not pursue it due to fear. - Believes that weight control could potentially alleviate his sleep apnea symptoms.  Follow-up: The patient will follow up in January for an annual physical examination.  1. Insomnia, unspecified type (Primary) - traZODone (DESYREL) 50 mg tablet; Take 1 tablet (50 mg total) by mouth at bedtime.  Dispense: 90 tablet; Refill: 1  2. Benign prostatic hyperplasia with nocturia - tamsulosin  (FLOMAX ) 0.4 mg cap; Take 1 capsule (0.4 mg total) by mouth daily.  Dispense: 90 capsule; Refill: 1  3. Essential hypertension - bisoprolol (ZEBETA) 5 mg tablet; Take 1 tablet (5 mg total) by mouth daily.  Dispense:  90 tablet; Refill: 1  Problem List Items Addressed This Visit   None Visit Diagnoses       Insomnia, unspecified type    -  Primary   Relevant Medications   traZODone (DESYREL) 50 mg tablet     Benign prostatic hyperplasia with nocturia       Relevant Medications   tamsulosin  (FLOMAX ) 0.4 mg cap     Essential hypertension       Relevant Medications   bisoprolol (ZEBETA) 5 mg tablet      Please contact my office for worsening conditions or problems, and seek emergency medical treatment and/or call 911 if you or your family deems either necessary.  I have personally spent 30 minutes involved in face-to-face and non-face-to-face activities for this patient on the day of the visit.  Professional time spent includes the following activities, in addition to  those noted in the documentation: Review of outside and prior records including labs, imaging, office notes, counseling of patient regarding nonpharmacologic and pharmacologic management of current conditions, diagnostic testing, differential diagnosis, goals of therapy and documentation.   No follow-ups on file.  Electronically signed by: Bernardino JAYSON Level, FNP 03/23/2024 2:08 PM        [1] Past Medical History: Diagnosis Date   Arthritis    GERD (gastroesophageal reflux disease)    Hypertension   [2] No family history on file. [3] Social History Socioeconomic History   Marital status: Married  Tobacco Use   Smoking status: Never   Smokeless tobacco: Never  Vaping Use   Vaping status: Never Used  Substance and Sexual Activity   Alcohol use: Never   Drug use: Never   Sexual activity: Yes    Partners: Female   Social Drivers of Health   Food Insecurity: Low Risk  (03/23/2024)   Food vital sign    Within the past 12 months, you worried that your food would run out before you got money to buy more: Never true    Within the past 12 months, the food you bought just didn't last and you didn't have money to get more: Never true  Transportation Needs: No Transportation Needs (03/23/2024)   Transportation    In the past 12 months, has lack of reliable transportation kept you from medical appointments, meetings, work or from getting things needed for daily living? : No  Safety: Low Risk  (03/23/2024)   Safety    How often does anyone, including family and friends, physically hurt you?: Never    How often does anyone, including family and friends, insult or talk down to you?: Never    How often does anyone, including family and friends, threaten you with harm?: Never    How often does anyone, including family and friends, scream or curse at you?: Never  Living Situation: Low Risk  (03/23/2024)   Living Situation    What is your living situation today?: I have a  steady place to live    Think about the place you live. Do you have problems with any of the following? Choose all that apply:: None/None on this list  [4] Past Surgical History: Procedure Laterality Date   JOINT REPLACEMENT     Left and Right Replacement   OTHER SURGICAL HISTORY     Procedure: OTHER SURGICAL HISTORY (kidney stones)  [5]  Current Outpatient Medications:    aspirin 81 mg chewable tablet, Take 81 mg by mouth Once Daily., Disp: , Rfl:    biotin 10 mg tab  tablet, Take 10,000 mcg by mouth., Disp: , Rfl:    cholecalciferol (VITAMIN D3) 1,000 unit (25 mcg) tablet, Take 1,000 Units by mouth Once Daily., Disp: , Rfl:    multivitamin (THERAGRAN) tab tablet, Take 1 tablet by mouth Once Daily., Disp: , Rfl:    omega 3-dha-epa-fish oil (OMEGA 3) 1,000 mg capsule, Take 1 g by mouth Once Daily., Disp: , Rfl:    amitriptyline (ELAVIL) 75 mg tablet, Take 75 mg by mouth Once Daily., Disp: , Rfl:    bisoprolol (ZEBETA) 5 mg tablet, Take 1 tablet (5 mg total) by mouth daily., Disp: 90 tablet, Rfl: 1   fluticasone propionate (FLONASE) 50 mcg/spray nasal spray, 2 sprays Once Daily., Disp: 1 Inhaler, Rfl: 11   tamsulosin  (FLOMAX ) 0.4 mg cap, Take 1 capsule (0.4 mg total) by mouth daily., Disp: 90 capsule, Rfl: 1   traZODone (DESYREL) 50 mg tablet, Take 1 tablet (50 mg total) by mouth at bedtime., Disp: 90 tablet, Rfl: 1 [6] Not on File [7] Past Medical History: Diagnosis Date   Arthritis    GERD (gastroesophageal reflux disease)    Hypertension   [8] Past Surgical History: Procedure Laterality Date   JOINT REPLACEMENT     Left and Right Replacement   OTHER SURGICAL HISTORY     Procedure: OTHER SURGICAL HISTORY (kidney stones)  [9] Not on File [10] No family history on file.

## 2024-04-19 NOTE — Progress Notes (Signed)
 BMP - slightly elevated glucose, otherwise fine  PSA - normal  CBC - normal; no evidence of infection or anemia

## 2024-04-19 NOTE — Progress Notes (Addendum)
 [facility] [place]  Return Visit Frank Blake. DOB: 1961/02/07  MRN: [id number] Visit Date: 04/19/2024  Encounter Provider: Bernardino JAYSON Level, FNP  Subjective Patient ID: Frank Blake. is a 63 y.o. male.  Chief Complaint  Patient presents with   Groin Pain    Left groin throbbing pain x2-3 days. Went to [facility] and was given abx for UTI. Urine is cloudy, lots of cells, frequency, no blood, no fever.    The following information was reviewed by members of the visit team:  Tobacco  Allergies  Meds  Med Hx  Surg Hx  Fam Hx  Soc Hx     Groin Pain The patient's primary symptoms include testicular pain. The patient's pertinent negatives include no penile discharge, penile pain or scrotal swelling. Associated symptoms include urgency. Pertinent negatives include no abdominal pain, chest pain, chills, constipation, coughing, diarrhea, dysuria, fever, flank pain, frequency, headaches, nausea, shortness of breath, sore throat or vomiting.    Patient presenting today for LEFT-sided groin pain x2-3 days.  Went to UC over the weekend and was diagnosed with a UTI and given Macrobid.  However, they did not have a provider present per patient and he was told to confirm diagnosis here at PCP office.  He is also experiencing urinary urgency and suprapubic pressure.  He denies dysuria or hematuria or urinary frequency.  He notes his urine has been yellow and cloudy as well.  Trying to hydrate as much as he can, but admits that he could do better.  Pertinent negatives include no SOB, CP, palps, vision changes, dizziness, headaches, N/V/D/C, abdominal pain, fever/chills/body aches.  History of Present Illness     Medical History[1] Family History[2] Social History[3] Surgical History[4] Current Medications[5] Allergies[6] Patient Active Problem List   Diagnosis Date Noted   Gastroesophageal reflux disease without esophagitis 09/26/2020   Nasal turbinate  hypertrophy 03/30/2019   Deviated septum 03/30/2019   Snoring 03/30/2019   Obstructive sleep apnea 03/30/2019        Review of Systems  Constitutional:  Negative for activity change, appetite change, chills, diaphoresis, fatigue and fever.  HENT:  Negative for congestion, ear pain, hearing loss, sinus pressure, sneezing, sore throat and trouble swallowing.   Eyes:  Negative for photophobia and visual disturbance.  Respiratory:  Negative for cough, chest tightness, shortness of breath and wheezing.   Cardiovascular:  Negative for chest pain, palpitations and leg swelling.  Gastrointestinal:  Negative for abdominal distention, abdominal pain, constipation, diarrhea, nausea and vomiting.  Endocrine: Negative for cold intolerance and heat intolerance.  Genitourinary:  Positive for testicular pain and urgency. Negative for difficulty urinating, dysuria, enuresis, flank pain, frequency, genital sores, hematuria, penile discharge, penile pain, penile swelling and scrotal swelling.  Musculoskeletal:  Negative for arthralgias, back pain, joint swelling and myalgias.  Neurological:  Negative for dizziness, light-headedness, numbness and headaches.  Hematological:  Negative for adenopathy. Does not bruise/bleed easily.  Psychiatric/Behavioral:  Negative for agitation, behavioral problems, confusion and decreased concentration. The patient is not nervous/anxious.     Objective Physical Exam Vitals and nursing note reviewed.  Constitutional:      Appearance: Normal appearance. He is obese.  HENT:     Head: Normocephalic and atraumatic.  Cardiovascular:     Rate and Rhythm: Normal rate and regular rhythm.     Pulses: Normal pulses.     Heart sounds: Normal heart sounds.  Pulmonary:     Effort: Pulmonary effort is normal. No respiratory distress.  Breath sounds: Normal breath sounds. No wheezing.  Abdominal:     General: Abdomen is protuberant. Bowel sounds are normal.     Palpations:  Abdomen is soft.     Tenderness: There is no abdominal tenderness. There is no guarding.  Genitourinary:    Pubic Area: No rash.      Penis: Normal and circumcised.      Testes:        Right: Mass, tenderness or swelling not present.        Left: Tenderness (mild) present. Mass or swelling not present.  Musculoskeletal:        General: Normal range of motion.     Cervical back: Normal range of motion and neck supple.  Skin:    General: Skin is warm and dry.  Neurological:     General: No focal deficit present.     Mental Status: He is alert and oriented to person, place, and time.  Psychiatric:        Mood and Affect: Mood normal.        Behavior: Behavior normal.       Component Ref Range & Units (hover) 10:18  Color, Urine Yellow  Clarity, Urine Clear  Glucose, Urine Negative  Bilirubin, Urine Negative  Ketones, Urine Negative  Specific Gravity, Urine 1.020  Blood, Urine Negative  pH, Urine 6.5  Protein, Urine Negative  Urobilinogen, Urine 0.2  Nitrite, Urine Negative  Leukocyte Esterase, Urine Negative  Kit/Device Lot # [id number]  Kit/Device Expiration Date 3697973  Resulting Agency [facility] <redacted file path>      Assessment/Plan Diagnoses and all orders for this visit:  Assessment & Plan 1. Left groin pain: - Left groin pain has been present for 2 to 3 days, [facility] diagnosed as a [facility] and treated with Macrobid. No blood in the urine, no fever, and no burning sensation during urination, but some urgency and bladder pressure are reported.  - No evidence of lumps or significant swelling in the testicular area. - A urine culture will be sent for further analysis. A testicular ultrasound will be ordered to rule out other potential causes. Blood tests, including PSA, CBC, and metabolic panel, will be conducted. - Continue taking Macrobid if it alleviates symptoms. Tylenol  can be taken for pain management. If the pain intensifies or becomes unbearable,  immediate medical attention at the ER is advised to rule out testicular torsion.    UA negative and clear in office - will still continue Macrobid if helping with symptoms, but will D/C if urine culture comes back normal.  Will send for US  Testicular and educated on red flag symptoms that would require immediate ER management.  BW ordered today to include BMP, CBC, and PSA.  Return if symptoms do not improve.  Left groin pain -     POC Urinalysis Auto without Microscopic -     Urine Culture; Future -     US  Testicular With Doppler; Future -     PSA, Total (Screen) -     CBC with Differential -     Basic Metabolic Panel  Other orders -     nitrofurantoin, macrocrystal-monohydrate, (MACROBID) 100 mg capsule; Take 100 mg by mouth 2 (two) times a day.  Problem List Items Addressed This Visit   None Visit Diagnoses       Left groin pain    -  Primary   Relevant Orders   POC Urinalysis Auto without Microscopic (Completed)   Urine Culture  US  Testicular With Doppler   PSA, Total (Screen)   CBC with Differential   Basic Metabolic Panel      Please contact my office for worsening conditions or problems, and seek emergency medical treatment and/or call 911 if you or your family deems either necessary.  I have personally spent 20 minutes involved in face-to-face and non-face-to-face activities for this patient on the day of the visit.  Professional time spent includes the following activities, in addition to those noted in the documentation: Review of outside and prior records including labs, imaging, office notes, counseling of patient regarding nonpharmacologic and pharmacologic management of current conditions, diagnostic testing, differential diagnosis, goals of therapy and documentation.   Electronically signed: Bernardino JAYSON Level, FNP 04/19/2024  10:09 AM        [1] Past Medical History: Diagnosis Date   Arthritis    GERD (gastroesophageal reflux disease)    Hypertension    [2] No family history on file. [3] Social History Socioeconomic History   Marital status: Married  Tobacco Use   Smoking status: Never   Smokeless tobacco: Never  Vaping Use   Vaping status: Never Used  Substance and Sexual Activity   Alcohol use: Never   Drug use: Never   Sexual activity: Yes    Partners: Female   Social Drivers of Health   Living Situation: Low Risk (04/19/2024)   Living Situation    What is your living situation today?: I have a steady place to live    Think about the place you live. Do you have problems with any of the following? Choose all that apply:: None/None on this list  Food Insecurity: Low Risk (04/19/2024)   Food vital sign    Within the past 12 months, you worried that your food would run out before you got money to buy more: Never true    Within the past 12 months, the food you bought just didn't last and you didn't have money to get more: Never true  Transportation Needs: No Transportation Needs (04/19/2024)   Transportation    In the past 12 months, has lack of reliable transportation kept you from medical appointments, meetings, work or from getting things needed for daily living? : No  Utilities: Low Risk (04/19/2024)   Utilities    In the past 12 months has the electric, gas, oil, or water  company threatened to shut off services in your home? : No  Safety: Low Risk (04/19/2024)   Safety    How often does anyone, including family and friends, physically hurt you?: Never    How often does anyone, including family and friends, insult or talk down to you?: Never    How often does anyone, including family and friends, threaten you with harm?: Never    How often does anyone, including family and friends, scream or curse at you?: Never  Alcohol Screening: Not At Risk (04/19/2024)   Alcohol    Audit C Alcohol risk score: 0  Tobacco Use: Low Risk (04/19/2024)   Patient History    Smoking Tobacco Use: Never    Smokeless Tobacco  Use: Never  Depression: Not At Risk (04/19/2024)   PHQ-2    PHQ-2 Score: 0  [4] Past Surgical History: Procedure Laterality Date   JOINT REPLACEMENT     Left and Right Replacement   OTHER SURGICAL HISTORY     Procedure: OTHER SURGICAL HISTORY (kidney stones)  [5]  Current Outpatient Medications:    aspirin 81 mg chewable tablet, Take  81 mg by mouth Once Daily., Disp: , Rfl:    biotin 10 mg tab tablet, Take 10,000 mcg by mouth., Disp: , Rfl:    cholecalciferol (VITAMIN D3) 1,000 unit (25 mcg) tablet, Take 1,000 Units by mouth Once Daily., Disp: , Rfl:    multivitamin (THERAGRAN) tab tablet, Take 1 tablet by mouth Once Daily., Disp: , Rfl:    nitrofurantoin, macrocrystal-monohydrate, (MACROBID) 100 mg capsule, Take 100 mg by mouth 2 (two) times a day., Disp: , Rfl:    omega 3-dha-epa-fish oil (OMEGA 3) 1,000 mg capsule, Take 1 g by mouth Once Daily., Disp: , Rfl:    tamsulosin  (FLOMAX ) 0.4 mg cap, Take 1 capsule (0.4 mg total) by mouth daily., Disp: 90 capsule, Rfl: 1   traZODone (DESYREL) 50 mg tablet, Take 1 tablet (50 mg total) by mouth at bedtime., Disp: 90 tablet, Rfl: 1   bisoprolol (ZEBETA) 5 mg tablet, Take 1 tablet (5 mg total) by mouth daily. (Patient not taking: Reported on 04/19/2024), Disp: 90 tablet, Rfl: 1   fluticasone propionate (FLONASE) 50 mcg/spray nasal spray, 2 sprays Once Daily. (Patient not taking: Reported on 04/19/2024), Disp: 1 Inhaler, Rfl: 11 [6] No Known Allergies

## 2024-04-20 NOTE — Progress Notes (Signed)
 No growth of bacteria - can stop Macrobid

## 2024-04-20 NOTE — Progress Notes (Signed)
 Trace left hydrocele (accumulation of fluid in the scrotum) and bilateral varicoceles (enlarged vein(s) in the scrotum) - we can refer to Urology if still having pain.

## 2024-05-23 NOTE — Progress Notes (Signed)
 [facility] [place]  Health Maintenance Visit Frank Blake. DOB: 1960/12/16  MRN: [id number] Visit Date: 05/24/2024  Encounter Provider: Gustav Almarie Pack, FNP  HPI HEALTH MAINTENANCE: Frank Blake. presents for a health maintenance exam. SCREENING TESTS: ---Last physical exam: Unknown  ---He does perform self-testicular exams ---Last colonoscopy: Due in May 2026 ---Last PSA: 04/19/24-- normal  ---Last dentist visit: Last year ---Vision problems: Glasses, sees eye doctor  ---Blood pressure problems: Takes medication  LIFESTYLE:  ---Sleep hours: 5-6  ---Seatbelt use: always ---Diet: Balanced  ---Exercise: None  LAB/IMMUNIZATIONS: ---Last lipid profile: Unknown- will collect today  ---Last tetanus vaccine: Unknown- over 10 year ---Last influenza vaccine: Fall 2025  ---COVID vaccine: 2 series  ---Last pneumococcal vaccine: Declines ---Shingrix vaccine: 1 shot, 04/09/2023 -- declines second injection today   MEDICATIONS: OTC Vitamins: None Other OTC Meds: None  Family history of prostate cancer: no.  Family history of colon cancer: yes.  Family history of premature death from CVD: no.  Family history of hyperlipidemia or hypertension: no.  Other issues discussed today:  Insomnia Medication: Trazodone 50 mg Continues to wake up frequently   BPH  Sees urology- has a cyst on left testicle. Goes back on January 22  Continues to have throbbing intermittently  Medication: Flomax  0.4 mg, prescribed by urology   Hypertension Medication: bisoprolol 5mg  SYMPTOMS: Pertinent negatives - none HABITS: --Patient has been following a reduced sodium diet. -- He is not getting adequate exercise. HOME BLOOD PRESSURE: does not check BP at home. MEDICATIONS: He is taking antihypertensive medications correctly. RISK FACTORS: Smoking: no Exercise: no  ROS-  Review of Systems  Constitutional: Negative.  Negative for activity change, chills,  fatigue, fever and unexpected weight change.  HENT:  Negative for congestion, ear discharge, ear pain, hearing loss, nosebleeds, postnasal drip, rhinorrhea, sinus pressure, sinus pain and sore throat.   Eyes:  Negative for photophobia, pain, redness and visual disturbance.  Respiratory: Negative.  Negative for apnea, cough, chest tightness, shortness of breath and wheezing.   Cardiovascular:  Negative for chest pain, palpitations and leg swelling.  Gastrointestinal:  Negative for abdominal distention, abdominal pain, blood in stool, constipation, diarrhea, nausea, rectal pain and vomiting.  Endocrine: Negative.   Genitourinary: Negative.  Negative for difficulty urinating, dysuria, flank pain, frequency, hematuria and urgency.  Musculoskeletal: Negative.   Skin: Negative.  Negative for color change, rash and wound.  Allergic/Immunologic: Negative.   Neurological:  Negative for dizziness, tremors, weakness, light-headedness and headaches.  Hematological: Negative.   Psychiatric/Behavioral:         - anxiety and depression      Behavioral Health Screening  Patient Health Questionnaire-2 Score: 0 (05/24/2024  8:14 AM)      Patient's Depression screening is Negative   Depression Plan: Normal/Negative Screening  Medical History: Medical History[1]  Patient Active Problem List   Diagnosis Date Noted   Gastroesophageal reflux disease without esophagitis 09/26/2020   Nasal turbinate hypertrophy 03/30/2019   Deviated septum 03/30/2019   Snoring 03/30/2019   Obstructive sleep apnea 03/30/2019    Current Medications:  Medications Ordered Prior to Encounter[2] Current Medications[3]  Allergies: Allergies[4]   Immunizations:  Immunization History  Administered Date(s) Administered   Moderna SARS-CoV-2 Primary Series 12+ yrs 09/10/2019, 10/14/2019    Surgical History- Surgical History[5]  Family history- Family History[6] Social history- Social History[7]   Physical  Exam: BP 131/69   Pulse 63   Temp 97.8 F (36.6 C)   Ht 0.686 m (2'  3)   Wt 102 kg (224 lb 9.6 oz)   SpO2 96%   BMI 216.61 kg/m  Wt Readings from Last 3 Encounters:  05/24/24 102 kg (224 lb 9.6 oz)  04/19/24 103 kg (226 lb)  03/23/24 102 kg (223 lb 12.8 oz)   Physical Exam Vitals reviewed.  Constitutional:      Appearance: Normal appearance.  HENT:     Right Ear: Tympanic membrane, ear canal and external ear normal. There is no impacted cerumen.     Left Ear: Tympanic membrane, ear canal and external ear normal. There is no impacted cerumen.     Nose: Nose normal. No congestion or rhinorrhea.     Mouth/Throat:     Mouth: Mucous membranes are moist.     Pharynx: Oropharynx is clear. No oropharyngeal exudate or posterior oropharyngeal erythema.  Eyes:     General:        Right eye: No discharge.        Left eye: No discharge.     Extraocular Movements: Extraocular movements intact.     Conjunctiva/sclera: Conjunctivae normal.     Pupils: Pupils are equal, round, and reactive to light.  Cardiovascular:     Rate and Rhythm: Normal rate and regular rhythm.     Pulses: Normal pulses.     Heart sounds: Normal heart sounds. No murmur heard. Pulmonary:     Effort: Pulmonary effort is normal. No respiratory distress.     Breath sounds: Normal breath sounds. No wheezing.  Chest:     Chest wall: No tenderness.  Abdominal:     General: Abdomen is flat. Bowel sounds are normal. There is no distension.     Palpations: Abdomen is soft. There is no mass.     Tenderness: There is no abdominal tenderness. There is no guarding.     Hernia: No hernia is present.  Musculoskeletal:        General: Normal range of motion.     Cervical back: Normal range of motion and neck supple. No tenderness.  Lymphadenopathy:     Cervical: No cervical adenopathy.  Skin:    General: Skin is warm.  Neurological:     General: No focal deficit present.     Mental Status: He is alert and oriented to  person, place, and time.  Psychiatric:        Mood and Affect: Mood normal.        Behavior: Behavior normal.        Thought Content: Thought content normal.        Judgment: Judgment normal.    Assessment and Plan: 1. Annual physical exam (Primary) - Hemoglobin A1C With Estimated Average Glucose - CBC with Differential - Comprehensive Metabolic Panel -reviewed recommendations for heart healthy diet and routine exercise with goal of 150 min/week. -Immunizations reviewed and counseled- UTD on flu, needs second shingles but declines this today- will get a pharmacy sometime. Declines pneumonia. Unsure when last tetanus was- he is going to check on this  PSA- normal December 2025  Colon cancer screening- due May 2026   2. Screening for lipid disorders - Lipid Panel  Will collect today. Fasting   3. Essential hypertension - bisoprolol (ZEBETA) 5 mg tablet; Take 1 tablet (5 mg total) by mouth daily.  Dispense: 90 tablet; Refill: 1  Stable on current medication. Denies chest pain, chest tightness, SOB, palpitations, peripheral edema- Refill sent in.  4. Benign prostatic hyperplasia with nocturia  Continue to see urology- keep  all upcoming appointemnts   5. Insomnia, unspecified type - traZODone (DESYREL) 100 mg tablet; Take 1 tablet (100 mg total) by mouth at bedtime.  Dispense: 90 tablet; Refill: 0  He has currently been taking trazodone 50 mg and continues to wake up throughout the night. Will increase to 100 mg. He has a lot of 50 mg at home, recommended to double up on these and then a new refill will be sent in for 100 mg   6. Encounter for screening for malignant neoplasm of colon - Ambulatory referral to Gastroenterology; Future  Due in May 2026. Referral placed   7. Skin cancer screening - Ambulatory Referral to Dermatology; Future  Would like to establish care and have full body skin check- referral placed    Problem List Items Addressed This Visit   None Visit  Diagnoses       Annual physical exam    -  Primary   Relevant Orders   Hemoglobin A1C With Estimated Average Glucose   CBC with Differential   Comprehensive Metabolic Panel     Screening for lipid disorders       Relevant Orders   Lipid Panel     Essential hypertension       Relevant Medications   bisoprolol (ZEBETA) 5 mg tablet     Benign prostatic hyperplasia with nocturia         Insomnia, unspecified type       Relevant Medications   traZODone (DESYREL) 100 mg tablet     Encounter for screening for malignant neoplasm of colon       Relevant Orders   Ambulatory referral to Gastroenterology     Skin cancer screening       Relevant Orders   Ambulatory Referral to Dermatology       Return in about 6 months (around 11/21/2024).   Patient Instructions  Begin taking Trazodone 100 mg- can take 2 of the 50 mg to equal 100 mg nightly     Please contact my office for worsening conditions or problems, and seek emergency medical treatment and/or call 911 if you or your family deems either necessary.  Electronically signed by: Gustav Almarie Pack, FNP 05/24/2024 8:59 AM       [1] Past Medical History: Diagnosis Date   Arthritis    GERD (gastroesophageal reflux disease)    Hypertension   [2] Current Outpatient Medications on File Prior to Visit  Medication Sig Dispense Refill   aspirin 81 mg chewable tablet Take 81 mg by mouth Once Daily.     cholecalciferol (VITAMIN D3) 1,000 unit (25 mcg) tablet Take 1,000 Units by mouth Once Daily.     multivitamin (THERAGRAN) tab tablet Take 1 tablet by mouth Once Daily.     nitrofurantoin, macrocrystal-monohydrate, (MACROBID) 100 mg capsule Take 100 mg by mouth 2 (two) times a day.     omega 3-dha-epa-fish oil (OMEGA 3) 1,000 mg capsule Take 1 g by mouth Once Daily.     tamsulosin  (FLOMAX ) 0.4 mg cap Take 1 capsule (0.4 mg total) by mouth daily. 90 capsule 1   [DISCONTINUED] bisoprolol (ZEBETA) 5 mg tablet Take 1 tablet (5  mg total) by mouth daily. 90 tablet 1   [DISCONTINUED] traZODone (DESYREL) 50 mg tablet Take 1 tablet (50 mg total) by mouth at bedtime. 90 tablet 1   biotin 10 mg tab tablet Take 10,000 mcg by mouth.     fluticasone propionate (FLONASE) 50 mcg/spray nasal spray 2 sprays Once Daily. (Patient  not taking: Reported on 04/19/2024) 1 Inhaler 11   No current facility-administered medications on file prior to visit.  [3]  Current Outpatient Medications:    aspirin 81 mg chewable tablet, Take 81 mg by mouth Once Daily., Disp: , Rfl:    cholecalciferol (VITAMIN D3) 1,000 unit (25 mcg) tablet, Take 1,000 Units by mouth Once Daily., Disp: , Rfl:    multivitamin (THERAGRAN) tab tablet, Take 1 tablet by mouth Once Daily., Disp: , Rfl:    nitrofurantoin, macrocrystal-monohydrate, (MACROBID) 100 mg capsule, Take 100 mg by mouth 2 (two) times a day., Disp: , Rfl:    omega 3-dha-epa-fish oil (OMEGA 3) 1,000 mg capsule, Take 1 g by mouth Once Daily., Disp: , Rfl:    tamsulosin  (FLOMAX ) 0.4 mg cap, Take 1 capsule (0.4 mg total) by mouth daily., Disp: 90 capsule, Rfl: 1   biotin 10 mg tab tablet, Take 10,000 mcg by mouth., Disp: , Rfl:    bisoprolol (ZEBETA) 5 mg tablet, Take 1 tablet (5 mg total) by mouth daily., Disp: 90 tablet, Rfl: 1   fluticasone propionate (FLONASE) 50 mcg/spray nasal spray, 2 sprays Once Daily. (Patient not taking: Reported on 04/19/2024), Disp: 1 Inhaler, Rfl: 11   traZODone (DESYREL) 100 mg tablet, Take 1 tablet (100 mg total) by mouth at bedtime., Disp: 90 tablet, Rfl: 0 [4] No Known Allergies [5] Past Surgical History: Procedure Laterality Date   JOINT REPLACEMENT     Left and Right Replacement   OTHER SURGICAL HISTORY     Procedure: OTHER SURGICAL HISTORY (kidney stones)  [6] No family history on file. [7] Social History Socioeconomic History   Marital status: Married  Tobacco Use   Smoking status: Never   Smokeless tobacco: Never  Vaping Use   Vaping  status: Never Used  Substance and Sexual Activity   Alcohol use: Never   Drug use: Never   Sexual activity: Yes    Partners: Female   Social Drivers of Health   Living Situation: Low Risk (05/24/2024)   Living Situation    What is your living situation today?: I have a steady place to live    Think about the place you live. Do you have problems with any of the following? Choose all that apply:: None/None on this list  Food Insecurity: Low Risk (05/24/2024)   Food vital sign    Within the past 12 months, you worried that your food would run out before you got money to buy more: Never true    Within the past 12 months, the food you bought just didn't last and you didn't have money to get more: Never true  Transportation Needs: No Transportation Needs (04/19/2024)   Transportation    In the past 12 months, has lack of reliable transportation kept you from medical appointments, meetings, work or from getting things needed for daily living? : No  Utilities: Low Risk (05/24/2024)   Utilities    In the past 12 months has the electric, gas, oil, or water  company threatened to shut off services in your home? : No  Safety: High Risk (05/24/2024)   Safety    How often does anyone, including family and friends, physically hurt you?: Rarely    How often does anyone, including family and friends, insult or talk down to you?: Rarely    How often does anyone, including family and friends, threaten you with harm?: Never    How often does anyone, including family and friends, scream or curse at you?: Rarely  Alcohol Screening: Not At Risk (04/19/2024)   Alcohol    Audit C Alcohol risk score: 0  Tobacco Use: Low Risk (05/24/2024)   Patient History    Smoking Tobacco Use: Never    Smokeless Tobacco Use: Never  Depression: Not At Risk (05/24/2024)   PHQ-2    PHQ-2 Score: 0

## 2024-06-17 ENCOUNTER — Telehealth: Payer: Self-pay | Admitting: *Deleted

## 2024-06-17 NOTE — Telephone Encounter (Signed)
 Reasons for the colonoscopy: Screening   Have you had a colonoscopy before?  2021, Dr. Mavis (repeat 5 years for screening purposes per colon report)  Do you have family history of colon cancer? Yes per pt step sister and that is why he gets every 5 years screening   Previous colonoscopy with polyps removed? no  Do you have a history colorectal cancer?   no  Are you diabetic? If yes, Type 1 or Type 2?    no  Do you have a prosthetic or mechanical heart valve? no  Do you have a pacemaker/defibrillator?   no  Have you had endocarditis/atrial fibrillation? no  Have you had joint replacement within the last 12 months?  no  Do you tend to be constipated or have to use laxatives? no  Do you have any history of drugs or alcohol?  no  Do you use supplemental oxygen?  no  Have you had a stroke or heart attack within the last 6 months? no  Do you take weight loss medication?  no  Do you take any blood-thinning medications such as: (aspirin, warfarin, Plavix, Aggrenox)  baby aspirin 81mg   If yes we need the name, milligram, dosage and who is prescribing doctor    Current Outpatient Medications on File Prior to Visit  Medication Sig Dispense Refill   aspirin 81 MG chewable tablet Chew 81 mg by mouth daily.     bisoprolol (ZEBETA) 5 MG tablet Take 5 mg by mouth daily.     meloxicam (MOBIC) 15 MG tablet Take 15 mg by mouth daily.     tamsulosin  (FLOMAX ) 0.4 MG CAPS capsule Take 1 capsule (0.4 mg total) by mouth daily after supper. 90 capsule 3   traZODone (DESYREL) 100 MG tablet Take 100 mg by mouth at bedtime.     No current facility-administered medications on file prior to visit.     No Known Allergies

## 2024-06-17 NOTE — Telephone Encounter (Signed)
Ok to schedule.  Room :Any   Thanks,  Vista Lawman, MD Gastroenterology and Hepatology [facility]
